# Patient Record
Sex: Male | Born: 1955 | Race: White | Hispanic: No | Marital: Married | Smoking: Never smoker
Health system: Southern US, Community
[De-identification: ages and names within clinical notes are randomized; demographics above are authoritative.]

## PROBLEM LIST (undated history)

## (undated) DIAGNOSIS — L309 Dermatitis, unspecified: Secondary | ICD-10-CM

## (undated) DIAGNOSIS — C61 Malignant neoplasm of prostate: Secondary | ICD-10-CM

## (undated) HISTORY — PX: KNEE ARTHROSCOPY: SUR90

---

## 1997-12-10 ENCOUNTER — Emergency Department (HOSPITAL_COMMUNITY): Admission: EM | Admit: 1997-12-10 | Discharge: 1997-12-10 | Payer: Self-pay | Admitting: Internal Medicine

## 1998-05-10 HISTORY — PX: THROAT SURGERY: SHX803

## 1998-07-04 ENCOUNTER — Other Ambulatory Visit: Admission: RE | Admit: 1998-07-04 | Discharge: 1998-07-04 | Payer: Self-pay | Admitting: Otolaryngology

## 1998-08-22 ENCOUNTER — Other Ambulatory Visit: Admission: RE | Admit: 1998-08-22 | Discharge: 1998-08-22 | Payer: Self-pay | Admitting: Family Medicine

## 2004-05-28 ENCOUNTER — Emergency Department (HOSPITAL_COMMUNITY): Admission: EM | Admit: 2004-05-28 | Discharge: 2004-05-28 | Payer: Self-pay | Admitting: Emergency Medicine

## 2013-04-25 ENCOUNTER — Other Ambulatory Visit: Payer: Self-pay | Admitting: Urology

## 2013-05-02 ENCOUNTER — Other Ambulatory Visit: Payer: Self-pay | Admitting: Urology

## 2013-05-18 ENCOUNTER — Other Ambulatory Visit (HOSPITAL_COMMUNITY): Payer: Self-pay | Admitting: *Deleted

## 2013-05-22 ENCOUNTER — Ambulatory Visit (HOSPITAL_COMMUNITY)
Admission: RE | Admit: 2013-05-22 | Discharge: 2013-05-22 | Disposition: A | Discharge status: Home / Self Care | Payer: BC Managed Care – PPO | Source: Ambulatory Visit | Attending: Urology | Admitting: Urology

## 2013-05-22 ENCOUNTER — Encounter (INDEPENDENT_AMBULATORY_CARE_PROVIDER_SITE_OTHER): Payer: Self-pay

## 2013-05-22 ENCOUNTER — Encounter (HOSPITAL_COMMUNITY): Payer: Self-pay

## 2013-05-22 ENCOUNTER — Encounter (HOSPITAL_COMMUNITY): Payer: Self-pay | Admitting: Pharmacy Technician

## 2013-05-22 ENCOUNTER — Encounter (HOSPITAL_COMMUNITY)
Admission: RE | Admit: 2013-05-22 | Discharge: 2013-05-22 | Disposition: A | Discharge status: Home / Self Care | Payer: BC Managed Care – PPO | Source: Ambulatory Visit | Attending: Urology | Admitting: Urology

## 2013-05-22 DIAGNOSIS — Z0183 Encounter for blood typing: Secondary | ICD-10-CM | POA: Insufficient documentation

## 2013-05-22 DIAGNOSIS — C61 Malignant neoplasm of prostate: Secondary | ICD-10-CM | POA: Insufficient documentation

## 2013-05-22 DIAGNOSIS — Z01810 Encounter for preprocedural cardiovascular examination: Secondary | ICD-10-CM | POA: Insufficient documentation

## 2013-05-22 DIAGNOSIS — Z01818 Encounter for other preprocedural examination: Secondary | ICD-10-CM | POA: Insufficient documentation

## 2013-05-22 DIAGNOSIS — Z01812 Encounter for preprocedural laboratory examination: Secondary | ICD-10-CM | POA: Insufficient documentation

## 2013-05-22 HISTORY — DX: Dermatitis, unspecified: L30.9

## 2013-05-22 HISTORY — DX: Malignant neoplasm of prostate: C61

## 2013-05-22 LAB — CBC
HCT: 38.9 % — ABNORMAL LOW (ref 39.0–52.0)
HEMOGLOBIN: 13.1 g/dL (ref 13.0–17.0)
MCH: 26.9 pg (ref 26.0–34.0)
MCHC: 33.7 g/dL (ref 30.0–36.0)
MCV: 79.9 fL (ref 78.0–100.0)
Platelets: 237 10*3/uL (ref 150–400)
RBC: 4.87 MIL/uL (ref 4.22–5.81)
RDW: 14.1 % (ref 11.5–15.5)
WBC: 3.9 10*3/uL — ABNORMAL LOW (ref 4.0–10.5)

## 2013-05-22 LAB — BASIC METABOLIC PANEL
BUN: 12 mg/dL (ref 6–23)
CO2: 29 mEq/L (ref 19–32)
Calcium: 9.2 mg/dL (ref 8.4–10.5)
Chloride: 105 mEq/L (ref 96–112)
Creatinine, Ser: 0.81 mg/dL (ref 0.50–1.35)
GFR calc Af Amer: >90 mL/min (ref >90)
GLUCOSE: 105 mg/dL — AB (ref 70–99)
POTASSIUM: 4.6 meq/L (ref 3.7–5.3)
SODIUM: 144 meq/L (ref 137–147)

## 2013-05-22 LAB — ABO/RH: ABO/RH(D): O POS

## 2013-05-22 NOTE — Patient Instructions (Signed)
Richard Delgado  05/22/2013                           YOUR PROCEDURE IS SCHEDULED ON:  05/24/13               PLEASE REPORT TO SHORT STAY CENTER AT : 8:30 AM               CALL THIS NUMBER IF ANY PROBLEMS THE DAY OF SURGERY :               [phone]                      REMEMBER:   Do not eat food or drink liquids AFTER MIDNIGHT   Take these medicines the morning of surgery with A SIP OF WATER: NONE   Do not wear jewelry, make-up   Do not wear lotions, powders, or perfumes.   Do not shave legs or underarms 12 hrs. before surgery (men may shave face)  Do not bring valuables to the hospital.  Contacts, dentures or bridgework may not be worn into surgery.  Leave suitcase in the car. After surgery it may be brought to your room.  For patients admitted to the hospital more than one night, checkout time is 11:00                          The day of discharge.   Patients discharged the day of surgery will not be allowed to drive home                             If going home same day of surgery, must have someone stay with you first                           24 hrs at home and arrange for some one to drive you home from hospital.    Special Instructions:   Please read over the following fact sheets that you were given:               1. [employer]                     2. [place]                                                X_____________________________________________________________________        Failure to follow these instructions may result in cancellation of your surgery

## 2013-05-23 NOTE — H&P (Signed)
Chief Complaint Prostate Cancer   Reason For Visit Reason for consult: To discuss treatment options for prostate cancer and specifically to consider a robotic prostatectomy.  Physician requesting consult: Dr. Franchot Gallo  PCP: Dr. Azalia Bilis   History of Present Illness Richard Delgado is a 58 year old who had been on testosterone replacement therapy and was noted to have a rising PSA. He was seen by Dr. Diona Fanti for this reason and his PSA was repeated and noted to be 7.96. He stopped TRT and underwent a prostate biopsy on 03/21/13 which demonstrated Gleason 3+3=6 adenocarcinoma of the prostate with 8 out of 12 biopsy cores positive for malignancy. He has no family history of prostate cancer. He was thoroughly counseled by Dr. Diona Fanti about his treatment options and decided to proceed with surgical treatment. He was actually seen by Dr. Risa Grill for this reason and had been scheduled for surgery but wanted to have his surgery sooner than Dr. Cy Blamer schedule would allow.   He currently is off testosterone replacement therapy Richard Delgado) although states that he did receive significant quality of life benefit when taking this medication. His main symptoms include a decreased energy level, depressed mood, decreased libido, and erectile dysfunction.   TNM stage: cT1c Nx Mx PSA: 7.96 Gleason score: 3+3=6 Biopsy (03/21/13): 8/12 cores positive   Left: L lateral apex (50%), L lateral mid (20%), L lateral base (40%)   Right: R apex (50%), R mid (80%), R lateral mid (80%), R base (10%), R lateral base (<5%) Prostate volume: 24.0 c  Nomogram OC disease: 54% EPE: 39% SVI: 5% LNI: 4% PFS: 88% at 5 years, 80% at 10 years  Urinary function: IPSS is 4. Erectile function: SHIM score is 16.   Past Medical History Problems  1. History of depression (V11.8)  Surgical History Problems  1. History of Knee Surgery 2. History of Throat Surgery  His knee surgery with arthroscopic  surgery.   Current Meds 1. Aspirin 81 MG Oral Tablet;  Therapy: (Recorded:14Jun2013) to Recorded  Allergies Medication  1. No Known Drug Allergies  Family History Problems  1. Family history of Family Health Status Number Of Children   4 sons 2. Denied: Family history of prostate cancer  Social History Problems    Denied: History of Alcohol Use   Marital History - Currently Married   Never A Smoker   Occupation:   self employeed  Review of Systems Constitutional, skin, eye, otolaryngeal, hematologic/lymphatic, cardiovascular, pulmonary, endocrine, musculoskeletal, gastrointestinal, neurological and psychiatric system(s) were reviewed and pertinent findings if present are noted.    Vitals Vital Signs [Data Includes: Last 1 Day]  Recorded: [id number] 08:07AM  Blood Pressure: 134 / 81 Temperature: 97.8 F Heart Rate: 63  Physical Exam Constitutional: Well nourished and well developed . No acute distress.  ENT:. The ears and nose are normal in appearance.  Neck: The appearance of the neck is normal and no neck mass is present.  Pulmonary: No respiratory distress, normal respiratory rhythm and effort and clear bilateral breath sounds.  Cardiovascular: Heart rate and rhythm are normal . No peripheral edema.  Abdomen: The abdomen is soft and nontender. No masses are palpated. No CVA tenderness. No hernias are palpable. No hepatosplenomegaly noted.  Rectal: Rectal exam demonstrates normal sphincter tone, no tenderness and no masses. Prostate size is estimated to be 30 g. The prostate has no nodularity and is not tender. The left seminal vesicle is nonpalpable. The right seminal vesicle is nonpalpable. The perineum is normal  on inspection.  Lymphatics: The femoral and inguinal nodes are not enlarged or tender.  Skin: Normal skin turgor, no visible rash and no visible skin lesions.  Neuro/Psych:. Mood and affect are appropriate.    Results/Data Urine [Data Includes: Last 1  Day]   [id number]  COLOR YELLOW   APPEARANCE CLEAR   SPECIFIC GRAVITY 1.025   pH 6.0   GLUCOSE NEG mg/dL  BILIRUBIN NEG   KETONE NEG mg/dL  BLOOD NEG   PROTEIN NEG mg/dL  UROBILINOGEN 0.2 mg/dL  NITRITE NEG   LEUKOCYTE ESTERASE TRACE   SQUAMOUS EPITHELIAL/HPF NONE SEEN   WBC 0-2 WBC/hpf  RBC 0-2 RBC/hpf  BACTERIA NONE SEEN   CRYSTALS NONE SEEN   CASTS NONE SEEN    I have reviewed his medical records, PSA results, and pathology report. Findings are as dictated above.   Assessment  Adenocarcinoma of prostate (185) (C61)   Plan Adenocarcinoma of prostate  1. Follow-up Keep Future Appt Office  Follow-up  Status: Complete  Done: [id number] Health Maintenance  2. UA With REFLEX; [Do Not Release]; Status:Complete;   [id number] 08:03AM  Discussion/Summary 1. Prostate cancer: I had a detailed discussion with Richard Delgado and his wife today regarding his diagnosis and treatment options. He has decided to proceed with surgical therapy and is here today specifically to discuss that treatment option and to proceed with surgery in the near future.   The patient was counseled about the natural history of prostate cancer and the standard treatment options that are available for prostate cancer. It was explained to him how his age and life expectancy, clinical stage, Gleason score, and PSA affect his prognosis, the decision to proceed with additional staging studies, as well as how that information influences recommended treatment strategies. We discussed the roles for active surveillance, radiation therapy, surgical therapy, androgen deprivation, as well as ablative therapy options for the treatment of prostate cancer as appropriate to his individual cancer situation. We discussed the risks and benefits of these options with regard to their impact on cancer control and also in terms of potential adverse events, complications, and impact on quiality of life particularly related to urinary,  bowel, and sexual function. The patient was encouraged to ask questions throughout the discussion today and all questions were answered to his stated satisfaction. In addition, the patient was provided with and/or directed to appropriate resources and literature for further education about prostate cancer and treatment options.   We discussed surgical therapy for prostate cancer including the different available surgical approaches. We discussed, in detail, the risks and expectations of surgery with regard to cancer control, urinary control, and erectile function as well as the expected postoperative recovery process. Additional risks of surgery including but not limited to bleeding, infection, hernia formation, nerve damage, lymphocele formation, bowel/rectal injury potentially necessitating colostomy, damage to the urinary tract resulting in urine leakage, urethral stricture, and the cardiopulmonary risks such as myocardial infarction, stroke, death, venothromboembolism, etc. were explained. The risk of open surgical conversion for robotic/laparoscopic prostatectomy was also discussed.     He agrees to proceed and will be scheduled for a bilateral nerve sparing robotic-assisted laparoscopic radical prostatectomy.    2. Testosterone deficiency: We also discussed the potential risks and benefits of testosterone replacement therapy after definitive curative treatment for prostate cancer. He understands that this may be a reasonable therapy to consider assuming that his PSA becoming undetectable after treatment and there is a reasonable time to assure him that he is indeed  disease free. Considering the significant benefit that he received as related to his quality of life, this is a reasonable option to consider in the future.    Cc: Dr. Azalia Bilis  Dr. Franchot Gallo  A total of 45 minutes were spent in the overall care of the patient today with 40 minutes in direct face to face consultation.     Signatures Electronically signed by : Raynelle Bring, M.D.; May 15 2013  9:07AM EST

## 2013-05-24 ENCOUNTER — Inpatient Hospital Stay (HOSPITAL_COMMUNITY): Payer: BC Managed Care – PPO | Admitting: Anesthesiology

## 2013-05-24 ENCOUNTER — Inpatient Hospital Stay (HOSPITAL_COMMUNITY)
Admission: RE | Admit: 2013-05-24 | Discharge: 2013-05-25 | DRG: 708 | Disposition: A | Discharge status: Home / Self Care | Payer: BC Managed Care – PPO | Source: Ambulatory Visit | Attending: Urology | Admitting: Urology

## 2013-05-24 ENCOUNTER — Encounter (HOSPITAL_COMMUNITY): Payer: BC Managed Care – PPO | Admitting: Anesthesiology

## 2013-05-24 ENCOUNTER — Encounter (HOSPITAL_COMMUNITY): Payer: Self-pay | Admitting: *Deleted

## 2013-05-24 ENCOUNTER — Encounter (HOSPITAL_COMMUNITY)
Admission: RE | Disposition: A | Discharge status: Home / Self Care | Payer: Self-pay | Source: Ambulatory Visit | Attending: Urology

## 2013-05-24 DIAGNOSIS — Z7982 Long term (current) use of aspirin: Secondary | ICD-10-CM

## 2013-05-24 DIAGNOSIS — R6882 Decreased libido: Secondary | ICD-10-CM | POA: Diagnosis present

## 2013-05-24 DIAGNOSIS — N529 Male erectile dysfunction, unspecified: Secondary | ICD-10-CM | POA: Diagnosis present

## 2013-05-24 DIAGNOSIS — C61 Malignant neoplasm of prostate: Principal | ICD-10-CM | POA: Diagnosis present

## 2013-05-24 HISTORY — PX: ROBOT ASSISTED LAPAROSCOPIC RADICAL PROSTATECTOMY: SHX5141

## 2013-05-24 LAB — HEMOGLOBIN AND HEMATOCRIT, BLOOD
HCT: 33.8 % — ABNORMAL LOW (ref 39.0–52.0)
Hemoglobin: 11.3 g/dL — ABNORMAL LOW (ref 13.0–17.0)

## 2013-05-24 LAB — TYPE AND SCREEN
ABO/RH(D): O POS
ANTIBODY SCREEN: NEGATIVE

## 2013-05-24 SURGERY — ROBOTIC ASSISTED LAPAROSCOPIC RADICAL PROSTATECTOMY LEVEL 1
Anesthesia: General

## 2013-05-24 MED ORDER — KCL IN DEXTROSE-NACL 20-5-0.45 MEQ/L-%-% IV SOLN
INTRAVENOUS | Status: AC
  Administered 2013-05-24: 1000 mL
  Filled 2013-05-24: qty 1000

## 2013-05-24 MED ORDER — OXYCODONE HCL 5 MG PO TABS: 5.0000 mg | ORAL_TABLET | Freq: Once | ORAL | Status: DC | PRN

## 2013-05-24 MED ORDER — LACTATED RINGERS IV SOLN
INTRAVENOUS | Status: DC | PRN
  Administered 2013-05-24: 10:00:00 via INTRAVENOUS

## 2013-05-24 MED ORDER — CEFAZOLIN SODIUM 1-5 GM-% IV SOLN
1.0000 g | Freq: Three times a day (TID) | INTRAVENOUS | Status: AC
  Administered 2013-05-24 – 2013-05-25 (×2): 1 g via INTRAVENOUS
  Filled 2013-05-24 (×4): qty 50

## 2013-05-24 MED ORDER — LACTATED RINGERS IV SOLN: INTRAVENOUS | Status: DC

## 2013-05-24 MED ORDER — DIPHENHYDRAMINE HCL 12.5 MG/5ML PO ELIX: 12.5000 mg | ORAL_SOLUTION | Freq: Four times a day (QID) | ORAL | Status: DC | PRN

## 2013-05-24 MED ORDER — HYDROMORPHONE HCL PF 1 MG/ML IJ SOLN
INTRAMUSCULAR | Status: AC
  Filled 2013-05-24: qty 1

## 2013-05-24 MED ORDER — CEFAZOLIN SODIUM-DEXTROSE 2-3 GM-% IV SOLR
2.0000 g | INTRAVENOUS | Status: AC
  Administered 2013-05-24: 2 g via INTRAVENOUS
  Filled 2013-05-24: qty 50

## 2013-05-24 MED ORDER — NEOSTIGMINE METHYLSULFATE 1 MG/ML IJ SOLN
INTRAMUSCULAR | Status: DC | PRN
  Administered 2013-05-24: 4 mg via INTRAVENOUS

## 2013-05-24 MED ORDER — GLYCOPYRROLATE 0.2 MG/ML IJ SOLN
INTRAMUSCULAR | Status: DC | PRN
  Administered 2013-05-24: 0.6 mg via INTRAVENOUS
  Administered 2013-05-24: 0.2 mg via INTRAVENOUS

## 2013-05-24 MED ORDER — SODIUM CHLORIDE 0.9 % IV SOLN
INTRAVENOUS | Status: DC | PRN
  Administered 2013-05-24 (×2): via INTRAVENOUS

## 2013-05-24 MED ORDER — ZOLPIDEM TARTRATE 5 MG PO TABS: 5.0000 mg | ORAL_TABLET | Freq: Every evening | ORAL | Status: DC | PRN

## 2013-05-24 MED ORDER — CIPROFLOXACIN HCL 500 MG PO TABS: 500.0000 mg | ORAL_TABLET | Freq: Two times a day (BID) | ORAL | Status: DC

## 2013-05-24 MED ORDER — DEXAMETHASONE SODIUM PHOSPHATE 10 MG/ML IJ SOLN
INTRAMUSCULAR | Status: DC | PRN
  Administered 2013-05-24: 10 mg via INTRAVENOUS

## 2013-05-24 MED ORDER — ACETAMINOPHEN 325 MG PO TABS: 650.0000 mg | ORAL_TABLET | ORAL | Status: DC | PRN

## 2013-05-24 MED ORDER — OXYCODONE HCL 5 MG/5ML PO SOLN
5.0000 mg | Freq: Once | ORAL | Status: DC | PRN
  Filled 2013-05-24: qty 5

## 2013-05-24 MED ORDER — CISATRACURIUM BESYLATE 20 MG/10ML IV SOLN
INTRAVENOUS | Status: AC
  Filled 2013-05-24: qty 10

## 2013-05-24 MED ORDER — ONDANSETRON HCL 4 MG/2ML IJ SOLN: 4.0000 mg | INTRAMUSCULAR | Status: DC | PRN

## 2013-05-24 MED ORDER — PROPOFOL 10 MG/ML IV BOLUS
INTRAVENOUS | Status: AC
  Filled 2013-05-24: qty 20

## 2013-05-24 MED ORDER — BACITRACIN-NEOMYCIN-POLYMYXIN 400-5-5000 EX OINT: 1.0000 "application " | TOPICAL_OINTMENT | Freq: Three times a day (TID) | CUTANEOUS | Status: DC | PRN

## 2013-05-24 MED ORDER — MENTHOL 3 MG MT LOZG
1.0000 | LOZENGE | OROMUCOSAL | Status: DC | PRN
  Filled 2013-05-24: qty 9

## 2013-05-24 MED ORDER — KETOROLAC TROMETHAMINE 15 MG/ML IJ SOLN
15.0000 mg | Freq: Four times a day (QID) | INTRAMUSCULAR | Status: DC
  Administered 2013-05-24 – 2013-05-25 (×3): 15 mg via INTRAVENOUS
  Filled 2013-05-24 (×6): qty 1

## 2013-05-24 MED ORDER — MIDAZOLAM HCL 5 MG/5ML IJ SOLN
INTRAMUSCULAR | Status: DC | PRN
  Administered 2013-05-24: 2 mg via INTRAVENOUS

## 2013-05-24 MED ORDER — KCL IN DEXTROSE-NACL 20-5-0.45 MEQ/L-%-% IV SOLN
INTRAVENOUS | Status: DC
  Administered 2013-05-24 – 2013-05-25 (×3): via INTRAVENOUS
  Filled 2013-05-24 (×4): qty 1000

## 2013-05-24 MED ORDER — SODIUM CHLORIDE 0.9 % IR SOLN
Status: DC | PRN
  Administered 2013-05-24: 1000 mL via INTRAVESICAL

## 2013-05-24 MED ORDER — SODIUM CHLORIDE 0.9 % IJ SOLN
INTRAMUSCULAR | Status: AC
  Filled 2013-05-24: qty 10

## 2013-05-24 MED ORDER — EPHEDRINE SULFATE 50 MG/ML IJ SOLN
INTRAMUSCULAR | Status: DC | PRN
  Administered 2013-05-24: 7.5 mg via INTRAVENOUS

## 2013-05-24 MED ORDER — HEPARIN SODIUM (PORCINE) 1000 UNIT/ML IJ SOLN
INTRAMUSCULAR | Status: AC
  Filled 2013-05-24: qty 1

## 2013-05-24 MED ORDER — EPHEDRINE SULFATE 50 MG/ML IJ SOLN
INTRAMUSCULAR | Status: AC
  Filled 2013-05-24: qty 1

## 2013-05-24 MED ORDER — MEPERIDINE HCL 50 MG/ML IJ SOLN: 6.2500 mg | INTRAMUSCULAR | Status: DC | PRN

## 2013-05-24 MED ORDER — ONDANSETRON HCL 4 MG/2ML IJ SOLN
INTRAMUSCULAR | Status: DC | PRN
  Administered 2013-05-24: 4 mg via INTRAVENOUS

## 2013-05-24 MED ORDER — LACTATED RINGERS IV SOLN
INTRAVENOUS | Status: DC | PRN
  Administered 2013-05-24: 11:00:00

## 2013-05-24 MED ORDER — FENTANYL CITRATE 0.05 MG/ML IJ SOLN
INTRAMUSCULAR | Status: AC
  Filled 2013-05-24: qty 5

## 2013-05-24 MED ORDER — SODIUM CHLORIDE 0.9 % IV BOLUS (SEPSIS)
1000.0000 mL | Freq: Once | INTRAVENOUS | Status: AC
  Administered 2013-05-24: 1000 mL via INTRAVENOUS

## 2013-05-24 MED ORDER — BUPIVACAINE-EPINEPHRINE 0.25% -1:200000 IJ SOLN
INTRAMUSCULAR | Status: DC | PRN
  Administered 2013-05-24: 27 mL

## 2013-05-24 MED ORDER — MIDAZOLAM HCL 2 MG/2ML IJ SOLN
INTRAMUSCULAR | Status: AC
  Filled 2013-05-24: qty 2

## 2013-05-24 MED ORDER — FENTANYL CITRATE 0.05 MG/ML IJ SOLN
INTRAMUSCULAR | Status: DC | PRN
  Administered 2013-05-24 (×2): 50 ug via INTRAVENOUS
  Administered 2013-05-24: 100 ug via INTRAVENOUS

## 2013-05-24 MED ORDER — LACTATED RINGERS IR SOLN
Status: DC | PRN
  Administered 2013-05-24: 1000 mL

## 2013-05-24 MED ORDER — ONDANSETRON HCL 4 MG/2ML IJ SOLN
INTRAMUSCULAR | Status: AC
  Filled 2013-05-24: qty 2

## 2013-05-24 MED ORDER — PROMETHAZINE HCL 25 MG/ML IJ SOLN: 6.2500 mg | INTRAMUSCULAR | Status: DC | PRN

## 2013-05-24 MED ORDER — HYDROCODONE-ACETAMINOPHEN 5-325 MG PO TABS: 1.0000 | ORAL_TABLET | Freq: Four times a day (QID) | ORAL | Status: DC | PRN

## 2013-05-24 MED ORDER — MORPHINE SULFATE 2 MG/ML IJ SOLN
2.0000 mg | INTRAMUSCULAR | Status: DC | PRN
  Administered 2013-05-24: 4 mg via INTRAVENOUS
  Filled 2013-05-24 (×2): qty 2

## 2013-05-24 MED ORDER — BUPIVACAINE-EPINEPHRINE PF 0.25-1:200000 % IJ SOLN
INTRAMUSCULAR | Status: AC
  Filled 2013-05-24: qty 30

## 2013-05-24 MED ORDER — DEXAMETHASONE SODIUM PHOSPHATE 10 MG/ML IJ SOLN
INTRAMUSCULAR | Status: AC
  Filled 2013-05-24: qty 1

## 2013-05-24 MED ORDER — ACETAMINOPHEN 325 MG PO TABS
650.0000 mg | ORAL_TABLET | ORAL | Status: DC | PRN
  Administered 2013-05-24: 650 mg via ORAL
  Filled 2013-05-24: qty 2

## 2013-05-24 MED ORDER — GLYCOPYRROLATE 0.2 MG/ML IJ SOLN
INTRAMUSCULAR | Status: AC
  Filled 2013-05-24: qty 4

## 2013-05-24 MED ORDER — HYDROMORPHONE HCL PF 1 MG/ML IJ SOLN
0.2500 mg | INTRAMUSCULAR | Status: DC | PRN
  Administered 2013-05-24 (×4): 0.5 mg via INTRAVENOUS

## 2013-05-24 MED ORDER — DIPHENHYDRAMINE HCL 50 MG/ML IJ SOLN: 12.5000 mg | Freq: Four times a day (QID) | INTRAMUSCULAR | Status: DC | PRN

## 2013-05-24 MED ORDER — CISATRACURIUM BESYLATE (PF) 10 MG/5ML IV SOLN
INTRAVENOUS | Status: DC | PRN
  Administered 2013-05-24: 8 mg via INTRAVENOUS
  Administered 2013-05-24: 2 mg via INTRAVENOUS
  Administered 2013-05-24: 4 mg via INTRAVENOUS

## 2013-05-24 MED ORDER — PHENOL 1.4 % MT LIQD
1.0000 | OROMUCOSAL | Status: DC | PRN
  Filled 2013-05-24: qty 177

## 2013-05-24 MED ORDER — DOCUSATE SODIUM 100 MG PO CAPS
100.0000 mg | ORAL_CAPSULE | Freq: Two times a day (BID) | ORAL | Status: DC
  Administered 2013-05-24 – 2013-05-25 (×2): 100 mg via ORAL
  Filled 2013-05-24 (×3): qty 1

## 2013-05-24 MED ORDER — PROPOFOL 10 MG/ML IV BOLUS
INTRAVENOUS | Status: DC | PRN
  Administered 2013-05-24: 150 mg via INTRAVENOUS

## 2013-05-24 MED ORDER — BISACODYL 10 MG RE SUPP: 10.0000 mg | Freq: Every day | RECTAL | Status: DC | PRN

## 2013-05-24 MED ORDER — BELLADONNA ALKALOIDS-OPIUM 16.2-60 MG RE SUPP: 1.0000 | Freq: Four times a day (QID) | RECTAL | Status: DC | PRN

## 2013-05-24 SURGICAL SUPPLY — 45 items
ADH SKN CLS APL DERMABOND .7 (GAUZE/BANDAGES/DRESSINGS) ×1
CABLE HIGH FREQUENCY MONO STRZ (ELECTRODE) ×2 IMPLANT
CANISTER SUCTION 2500CC (MISCELLANEOUS) ×2 IMPLANT
CATH FOLEY 2WAY SLVR 18FR 30CC (CATHETERS) ×2 IMPLANT
CATH ROBINSON RED A/P 16FR (CATHETERS) ×2 IMPLANT
CATH ROBINSON RED A/P 8FR (CATHETERS) ×2 IMPLANT
CATH TIEMANN FOLEY 18FR 5CC (CATHETERS) ×2 IMPLANT
CHLORAPREP W/TINT 26ML (MISCELLANEOUS) ×2 IMPLANT
CLIP LIGATING HEM O LOK PURPLE (MISCELLANEOUS) ×4 IMPLANT
CLOTH BEACON ORANGE TIMEOUT ST (SAFETY) ×2 IMPLANT
COVER SURGICAL LIGHT HANDLE (MISCELLANEOUS) ×2 IMPLANT
COVER TIP SHEARS 8 DVNC (MISCELLANEOUS) ×1 IMPLANT
COVER TIP SHEARS 8MM DA VINCI (MISCELLANEOUS) ×1
CUTTER ECHEON FLEX ENDO 45 340 (ENDOMECHANICALS) ×2 IMPLANT
DECANTER SPIKE VIAL GLASS SM (MISCELLANEOUS) ×1 IMPLANT
DERMABOND ADVANCED (GAUZE/BANDAGES/DRESSINGS) ×1
DERMABOND ADVANCED .7 DNX12 (GAUZE/BANDAGES/DRESSINGS) IMPLANT
DRAPE SURG IRRIG POUCH 19X23 (DRAPES) ×2 IMPLANT
DRSG TEGADERM 4X4.75 (GAUZE/BANDAGES/DRESSINGS) ×2 IMPLANT
DRSG TEGADERM 6X8 (GAUZE/BANDAGES/DRESSINGS) ×4 IMPLANT
ELECT REM PT RETURN 9FT ADLT (ELECTROSURGICAL) ×2
ELECTRODE REM PT RTRN 9FT ADLT (ELECTROSURGICAL) ×1 IMPLANT
GLOVE BIO SURGEON STRL SZ 6.5 (GLOVE) ×2 IMPLANT
GLOVE BIOGEL M STRL SZ7.5 (GLOVE) ×7 IMPLANT
GOWN STRL REUS W/TWL LRG LVL3 (GOWN DISPOSABLE) ×5 IMPLANT
GOWN STRL REUS W/TWL XL LVL3 (GOWN DISPOSABLE) ×2 IMPLANT
HOLDER FOLEY CATH W/STRAP (MISCELLANEOUS) ×2 IMPLANT
IV LACTATED RINGERS 1000ML (IV SOLUTION) ×2 IMPLANT
KIT ACCESSORY DA VINCI DISP (KITS) ×1
KIT ACCESSORY DVNC DISP (KITS) ×1 IMPLANT
NDL SAFETY ECLIPSE 18X1.5 (NEEDLE) ×1 IMPLANT
NEEDLE HYPO 18GX1.5 SHARP (NEEDLE) ×2
PACK ROBOT UROLOGY CUSTOM (CUSTOM PROCEDURE TRAY) ×2 IMPLANT
RELOAD GREEN ECHELON 45 (STAPLE) ×2 IMPLANT
SET TUBE IRRIG SUCTION NO TIP (IRRIGATION / IRRIGATOR) ×2 IMPLANT
SOLUTION ELECTROLUBE (MISCELLANEOUS) ×2 IMPLANT
SUT ETHILON 3 0 PS 1 (SUTURE) ×2 IMPLANT
SUT MNCRL 3 0 VIOLET RB1 (SUTURE) IMPLANT
SUT MNCRL AB 4-0 PS2 18 (SUTURE) ×4 IMPLANT
SUT MONOCRYL 3 0 RB1 (SUTURE) ×1
SUT VICRYL 0 UR6 27IN ABS (SUTURE) ×4 IMPLANT
SYR 27GX1/2 1ML LL SAFETY (SYRINGE) ×2 IMPLANT
TOWEL OR 17X26 10 PK STRL BLUE (TOWEL DISPOSABLE) ×2 IMPLANT
TOWEL OR NON WOVEN STRL DISP B (DISPOSABLE) ×2 IMPLANT
WATER STERILE IRR 1500ML POUR (IV SOLUTION) ×4 IMPLANT

## 2013-05-24 NOTE — Discharge Instructions (Signed)

## 2013-05-24 NOTE — Anesthesia Postprocedure Evaluation (Signed)
Anesthesia Post Note  Patient: Richard Delgado  Procedure(s) Performed: Procedure(s) (LRB): ROBOTIC ASSISTED LAPAROSCOPIC RADICAL PROSTATECTOMY LEVEL 1 (N/A)  Anesthesia type: General  Patient location: PACU  Post pain: Pain level controlled  Post assessment: Post-op Vital signs reviewed  Last Vitals: BP 152/85  Pulse 59  Temp(Src) 36.7 C (Oral)  Resp 10  SpO2 100%  Post vital signs: Reviewed  Level of consciousness: sedated  Complications: No apparent anesthesia complications

## 2013-05-24 NOTE — Op Note (Signed)
Preoperative diagnosis: Clinically localized adenocarcinoma of the prostate (clinical stage T1c Nx Mx)  Postoperative diagnosis: Clinically localized adenocarcinoma of the prostate (clinical stage T1c Nx Mx)  Procedure:  1. Robotic assisted laparoscopic radical prostatectomy (bilateral nerve sparing)  Surgeon: Roxy Horseman, Brooke Bonito. M.D.  Assistant: Leta Baptist, PA-C  Anesthesia: General  Complications: None  EBL: 100 mL  IVF:  1200 mL crystalloid  Specimens: 1. Prostate and seminal vesicles  Disposition of specimens: Pathology  Drains: 1. 20 Fr coude catheter 2. # 19 Blake pelvic drain  Indication: Richard Delgado is a 58 y.o. year old patient with clinically localized prostate cancer.  After a thorough review of the management options for treatment of prostate cancer, he elected to proceed with surgical therapy and the above procedure(s).  We have discussed the potential benefits and risks of the procedure, side effects of the proposed treatment, the likelihood of the patient achieving the goals of the procedure, and any potential problems that might occur during the procedure or recuperation. Informed consent has been obtained.  Description of procedure:  The patient was taken to the operating room and a general anesthetic was administered. He was given preoperative antibiotics, placed in the dorsal lithotomy position, and prepped and draped in the usual sterile fashion. Next a preoperative timeout was performed. A urethral catheter was placed into the bladder and a site was selected near the umbilicus for placement of the camera port. This was placed using a standard open Hassan technique which allowed entry into the peritoneal cavity under direct vision and without difficulty. A 12 mm port was placed and a pneumoperitoneum established. The camera was then used to inspect the abdomen and there was no evidence of any intra-abdominal injuries or other abnormalities. The  remaining abdominal ports were then placed. 8 mm robotic ports were placed in the right lower quadrant, left lower quadrant, and far left lateral abdominal wall. A 5 mm port was placed in the right upper quadrant and a 12 mm port was placed in the right lateral abdominal wall for laparoscopic assistance. All ports were placed under direct vision without difficulty. The surgical cart was then docked.   Utilizing the cautery scissors, the bladder was reflected posteriorly allowing entry into the space of Retzius and identification of the endopelvic fascia and prostate. The periprostatic fat was then removed from the prostate allowing full exposure of the endopelvic fascia. The endopelvic fascia was then incised from the apex back to the base of the prostate bilaterally and the underlying levator muscle fibers were swept laterally off the prostate thereby isolating the dorsal venous complex. The dorsal vein was then stapled and divided with a 45 mm Flex Echelon stapler. Attention then turned to the bladder neck which was divided anteriorly thereby allowing entry into the bladder and exposure of the urethral catheter. The catheter balloon was deflated and the catheter was brought into the operative field and used to retract the prostate anteriorly. The posterior bladder neck was then examined and was divided allowing further dissection between the bladder and prostate posteriorly until the vasa deferentia and seminal vessels were identified. The vasa deferentia were isolated, divided, and lifted anteriorly. The seminal vesicles were dissected down to their tips with care to control the seminal vascular arterial blood supply. These structures were then lifted anteriorly and the space between Denonvillier's fascia and the anterior rectum was developed with a combination of sharp and blunt dissection. This isolated the vascular pedicles of the prostate.  The lateral prostatic fascia  was then sharply incised allowing  release of the neurovascular bundles bilaterally. The vascular pedicles of the prostate were then ligated with Weck clips between the prostate and neurovascular bundles and divided with sharp cold scissor dissection resulting in neurovascular bundle preservation. The neurovascular bundles were then separated off the apex of the prostate and urethra bilaterally.  The urethra was then sharply transected allowing the prostate specimen to be disarticulated. The pelvis was copiously irrigated and hemostasis was ensured. There was no evidence for rectal injury.  Attention then turned to the urethral anastomosis. A 2-0 Vicryl slip knot was placed between Denonvillier's fascia, the posterior bladder neck, and the posterior urethra to reapproximate these structures. A double-armed 3-0 Monocryl suture was then used to perform a 360 running tension-free anastomosis between the bladder neck and urethra. A new urethral catheter was then placed into the bladder and irrigated. There were no blood clots within the bladder and the anastomosis appeared to be watertight. A #19 Blake drain was then brought through the left lateral 8 mm port site and positioned appropriately within the pelvis. It was secured to the skin with a nylon suture. The surgical cart was then undocked. The right lateral 12 mm port site was closed at the fascial level with a 0 Vicryl suture placed laparoscopically. All remaining ports were then removed under direct vision. The prostate specimen was removed intact within the Endopouch retrieval bag via the periumbilical camera port site. This fascial opening was closed with two running 0 Vicryl sutures. 0.25% Marcaine was then injected into all port sites and all incisions were reapproximated at the skin level with 4-0 Monocryl subcuticular sutures and Dermabond. The patient appeared to tolerate the procedure well and without complications. The patient was able to be extubated and transferred to the recovery  unit in satisfactory condition.  Pryor Curia MD

## 2013-05-24 NOTE — Progress Notes (Signed)
Patient ambulated in hall on RA x2 without any assistive device. Steady ambulation. Wife walked with him both times. Patient tolerated very well. Complains of no pain at this time. Resting comfortably. Will continue to monitor. Blanchard Kelch, RN

## 2013-05-24 NOTE — Progress Notes (Signed)
Dr. Alinda Money notified that patient ate all day yesterday and has not done a bowel prep. He was never given these instructions

## 2013-05-24 NOTE — Transfer of Care (Signed)
Immediate Anesthesia Transfer of Care Note  Patient: Richard Delgado  Procedure(s) Performed: Procedure(s): ROBOTIC ASSISTED LAPAROSCOPIC RADICAL PROSTATECTOMY LEVEL 1 (N/A)  Patient Location: PACU  Anesthesia Type:General  Level of Consciousness: awake, sedated and patient cooperative  Airway & Oxygen Therapy: Patient Spontanous Breathing and Patient connected to face mask oxygen  Post-op Assessment: Report given to PACU RN and Post -op Vital signs reviewed and stable  Post vital signs: Reviewed and stable  Complications: No apparent anesthesia complications

## 2013-05-24 NOTE — Progress Notes (Signed)
Hgb. And Hct. Drawn by lab. 

## 2013-05-24 NOTE — Interval H&P Note (Signed)
History and Physical Interval Note:  05/24/2013 9:59 AM  Richard Delgado  has presented today for surgery, with the diagnosis of PROSTATE CANCER  The various methods of treatment have been discussed with the patient and family. After consideration of risks, benefits and other options for treatment, the patient has consented to  Procedure(s): ROBOTIC ASSISTED LAPAROSCOPIC RADICAL PROSTATECTOMY LEVEL 1 (N/A) as a surgical intervention .  The patient's history has been reviewed, patient examined, no change in status, stable for surgery.  I have reviewed the patient's chart and labs.  Questions were answered to the patient's satisfaction.  Richard Delgado informed me that he did not complete his bowel prep and ate regular food yesterday. I again explained the small risk of rectal or bowel injury associated with this procedure and the increased risk of infection that may be present without bowel preparation.  He stated that he understood this risk and wished to proceed with surgery.  Considering that the overall risk would be very low, I agreed that it would be reasonable to proceed.   Richard Delgado,LES

## 2013-05-24 NOTE — Progress Notes (Signed)
Patient ID: Johnjoseph Rolfe, male   DOB: [birth year], 57 y.o.   MRN: [id number]  Post-op note  Subjective: The patient is doing well.  No complaints.  Objective: Vital signs in last 24 hours: Temp:  [96.8 F (36 C)-97.9 F (36.6 C)] 97.9 F (36.6 C) (01/15 1400) Pulse Rate:  [53-104] 60 (01/15 1400) Resp:  [11-18] 11 (01/15 1400) BP: (137-169)/(83-95) 169/92 mmHg (01/15 1345) SpO2:  [99 %-100 %] 100 % (01/15 1400)  Intake/Output from previous day:   Intake/Output this shift: Total I/O In: 3100 [I.V.:2100; IV Piggyback:1000] Out: 100 [Blood:100]  Physical Exam:  General: Alert and oriented. Abdomen: Soft, Nondistended. Incisions: Clean and dry.  Lab Results:  Recent Labs  05/22/13 0910 05/24/13 1302  HGB 13.1 11.3*  HCT 38.9* 33.8*    Assessment/Plan: POD#0   1) Continue to monitor, ambulate   Pryor Curia. MD   LOS: 0 days   Leslie Jester,LES 05/24/2013, 2:17 PM

## 2013-05-24 NOTE — Anesthesia Preprocedure Evaluation (Signed)
Anesthesia Evaluation  Patient identified by MRN, date of birth, ID band Patient awake    Reviewed: Allergy & Precautions, H&P , NPO status , Patient's Chart, lab work & pertinent test results  Airway Mallampati: II TM Distance: >3 FB Neck ROM: Full    Dental  (+) Dental Advisory Given   Pulmonary neg pulmonary ROS,  breath sounds clear to auscultation        Cardiovascular negative cardio ROS  Rhythm:Regular Rate:Normal     Neuro/Psych negative neurological ROS  negative psych ROS   GI/Hepatic negative GI ROS, Neg liver ROS,   Endo/Other  negative endocrine ROS  Renal/GU negative Renal ROS     Musculoskeletal negative musculoskeletal ROS (+)   Abdominal   Peds  Hematology negative hematology ROS (+)   Anesthesia Other Findings   Reproductive/Obstetrics negative OB ROS                           Anesthesia Physical Anesthesia Plan  ASA: I  Anesthesia Plan: General   Post-op Pain Management:    Induction: Intravenous  Airway Management Planned: Oral ETT  Additional Equipment:   Intra-op Plan:   Post-operative Plan: Extubation in OR  Informed Consent: I have reviewed the patients History and Physical, chart, labs and discussed the procedure including the risks, benefits and alternatives for the proposed anesthesia with the patient or authorized representative who has indicated his/her understanding and acceptance.   Dental advisory given  Plan Discussed with: CRNA  Anesthesia Plan Comments:         Anesthesia Quick Evaluation

## 2013-05-24 NOTE — Progress Notes (Signed)
Hgb. And Hct. Results noted. 

## 2013-05-25 ENCOUNTER — Encounter (HOSPITAL_COMMUNITY): Payer: Self-pay | Admitting: Urology

## 2013-05-25 LAB — HEMOGLOBIN AND HEMATOCRIT, BLOOD
HCT: 33.5 % — ABNORMAL LOW (ref 39.0–52.0)
Hemoglobin: 11.3 g/dL — ABNORMAL LOW (ref 13.0–17.0)

## 2013-05-25 MED ORDER — HYDROCODONE-ACETAMINOPHEN 5-325 MG PO TABS: 1.0000 | ORAL_TABLET | Freq: Four times a day (QID) | ORAL | Status: DC | PRN

## 2013-05-25 MED ORDER — BISACODYL 10 MG RE SUPP
10.0000 mg | Freq: Once | RECTAL | Status: AC
  Administered 2013-05-25: 10 mg via RECTAL
  Filled 2013-05-25: qty 1

## 2013-05-25 NOTE — Discharge Summary (Signed)
  Date of admission: 05/24/2013  Date of discharge: 05/25/2013  Admission diagnosis: Prostate Cancer  Discharge diagnosis: Prostate Cancer  History and Physical: For full details, please see admission history and physical. Briefly, Richard Delgado is a 58 y.o. gentleman with localized prostate cancer.  After discussing management/treatment options, he elected to proceed with surgical treatment.  Hospital Course: Richard Delgado was taken to the operating room on 05/24/2013 and underwent a robotic assisted laparoscopic radical prostatectomy. He tolerated this procedure well and without complications. Postoperatively, he was able to be transferred to a regular hospital room following recovery from anesthesia.  He was able to begin ambulating the night of surgery. He remained hemodynamically stable overnight.  He had excellent urine output with appropriately minimal output from his pelvic drain and his pelvic drain was removed on POD #1.  He was transitioned to oral pain medication, tolerated a clear liquid diet, and had met all discharge criteria and was able to be discharged home later on POD#1.  Laboratory values:  Recent Labs  05/24/13 1302 05/25/13 0507  HGB 11.3* 11.3*  HCT 33.8* 33.5*    Disposition: Home  Discharge instruction: He was instructed to be ambulatory but to refrain from heavy lifting, strenuous activity, or driving. He was instructed on urethral catheter care.  Discharge medications:     Medication List         ciprofloxacin 500 MG tablet  Commonly known as:  CIPRO  Take 1 tablet (500 mg total) by mouth 2 (two) times daily. Start day prior to office visit for foley removal     HYDROcodone-acetaminophen 5-325 MG per tablet  Commonly known as:  NORCO  Take 1-2 tablets by mouth every 6 (six) hours as needed.        Followup: He will followup in 1 week for catheter removal and to discuss his surgical pathology results.

## 2013-05-25 NOTE — Progress Notes (Signed)
Patient ID: Richard Delgado, male   DOB: 02/11/1956, 58 y.o.   MRN: [id number]  1 Day Post-Op Subjective: The patient is doing well.  No nausea or vomiting. Pain is adequately controlled.  Objective: Vital signs in last 24 hours: Temp:  [96.8 F (36 C)-98.4 F (36.9 C)] 98.3 F (36.8 C) (01/16 0544) Pulse Rate:  [53-104] 60 (01/16 0544) Resp:  [10-18] 18 (01/16 0544) BP: (132-169)/(77-96) 141/88 mmHg (01/16 0544) SpO2:  [97 %-100 %] 99 % (01/16 0544) Weight:  [84 kg (185 lb 3 oz)] 84 kg (185 lb 3 oz) (01/15 1524)  Intake/Output from previous day: 01/15 0701 - 01/16 0700 In: 5240 [P.O.:240; I.V.:3900; IV Piggyback:1100] Out: 8527 [Urine:3225; Drains:95; Blood:100] Intake/Output this shift:    Physical Exam:  General: Alert and oriented. CV: RRR Lungs: Clear bilaterally. GI: Soft, Nondistended. Incisions: Dressings intact. Urine: Clear Extremities: Nontender, no erythema, no edema.  Lab Results:  Recent Labs  05/22/13 0910 05/24/13 1302 05/25/13 0507  HGB 13.1 11.3* 11.3*  HCT 38.9* 33.8* 33.5*      Assessment/Plan: POD# 1 s/p robotic prostatectomy.  1) SL IVF 2) Ambulate, Incentive spirometry 3) Transition to oral pain medication 4) Dulcolax suppository 5) D/C pelvic drain 6) Plan for likely discharge later today   Pryor Curia. MD   LOS: 1 day   Dilynn Munroe,LES 05/25/2013, 7:34 AM

## 2013-05-25 NOTE — Care Management Note (Signed)
    Page 1 of 1   05/25/2013     12:48:42 PM   CARE MANAGEMENT NOTE 05/25/2013  Patient:  Richard Delgado, Richard Delgado   Account Number:  [id number]  Date Initiated:  05/25/2013  Documentation initiated by:  Dessa Phi  Subjective/Objective Assessment:   [address] [place].     Action/Plan:   FROM HOME.HAS PCP,PHARMACY.   Anticipated DC Date:  05/25/2013   Anticipated DC Plan:  [place]  CM consult      Choice offered to / List presented to:             Status of service:  In process, will continue to follow Medicare Important Message given?   (If response is "NO", the following Medicare IM given date fields will be blank) Date Medicare IM given:   Date Additional Medicare IM given:    Discharge Disposition:    Per UR Regulation:  Reviewed for med. necessity/level of care/duration of stay  If discussed at [place] of Stay Meetings, dates discussed:    Comments:  05/25/13 Lovett Coffin RN,BSN NCM [phone] POD! [email or web address] ANTICIPATED D/C NEEDS.

## 2015-07-05 ENCOUNTER — Emergency Department (HOSPITAL_BASED_OUTPATIENT_CLINIC_OR_DEPARTMENT_OTHER): Payer: BLUE CROSS/BLUE SHIELD

## 2015-07-05 ENCOUNTER — Encounter (HOSPITAL_BASED_OUTPATIENT_CLINIC_OR_DEPARTMENT_OTHER): Payer: Self-pay | Admitting: Emergency Medicine

## 2015-07-05 ENCOUNTER — Emergency Department (HOSPITAL_BASED_OUTPATIENT_CLINIC_OR_DEPARTMENT_OTHER)
Admission: EM | Admit: 2015-07-05 | Discharge: 2015-07-05 | Disposition: A | Discharge status: Home / Self Care | Payer: BLUE CROSS/BLUE SHIELD | Attending: Emergency Medicine | Admitting: Emergency Medicine

## 2015-07-05 DIAGNOSIS — W271XXA Contact with garden tool, initial encounter: Secondary | ICD-10-CM | POA: Insufficient documentation

## 2015-07-05 DIAGNOSIS — S61411A Laceration without foreign body of right hand, initial encounter: Secondary | ICD-10-CM

## 2015-07-05 DIAGNOSIS — S61011A Laceration without foreign body of right thumb without damage to nail, initial encounter: Secondary | ICD-10-CM | POA: Insufficient documentation

## 2015-07-05 DIAGNOSIS — Y9289 Other specified places as the place of occurrence of the external cause: Secondary | ICD-10-CM | POA: Insufficient documentation

## 2015-07-05 DIAGNOSIS — Y9389 Activity, other specified: Secondary | ICD-10-CM | POA: Diagnosis not present

## 2015-07-05 DIAGNOSIS — Z872 Personal history of diseases of the skin and subcutaneous tissue: Secondary | ICD-10-CM | POA: Insufficient documentation

## 2015-07-05 DIAGNOSIS — Y998 Other external cause status: Secondary | ICD-10-CM | POA: Diagnosis not present

## 2015-07-05 DIAGNOSIS — S62511B Displaced fracture of proximal phalanx of right thumb, initial encounter for open fracture: Secondary | ICD-10-CM | POA: Diagnosis not present

## 2015-07-05 DIAGNOSIS — Z8546 Personal history of malignant neoplasm of prostate: Secondary | ICD-10-CM | POA: Insufficient documentation

## 2015-07-05 DIAGNOSIS — S62609B Fracture of unspecified phalanx of unspecified finger, initial encounter for open fracture: Secondary | ICD-10-CM

## 2015-07-05 DIAGNOSIS — S6991XA Unspecified injury of right wrist, hand and finger(s), initial encounter: Secondary | ICD-10-CM | POA: Diagnosis present

## 2015-07-05 MED ORDER — LIDOCAINE HCL (PF) 1 % IJ SOLN
30.0000 mL | Freq: Once | INTRAMUSCULAR | Status: AC
  Administered 2015-07-05: 5 mL
  Filled 2015-07-05: qty 30

## 2015-07-05 MED ORDER — CEFAZOLIN SODIUM 1-5 GM-% IV SOLN
1.0000 g | Freq: Three times a day (TID) | INTRAVENOUS | Status: DC
  Administered 2015-07-05: 1 g via INTRAVENOUS
  Filled 2015-07-05: qty 50

## 2015-07-05 MED ORDER — DOXYCYCLINE HYCLATE 100 MG PO CAPS: 100.0000 mg | ORAL_CAPSULE | Freq: Two times a day (BID) | ORAL | Status: AC

## 2015-07-05 NOTE — ED Notes (Signed)
Rt thumb placed in sterile water per PA-C orders

## 2015-07-05 NOTE — ED Notes (Signed)
Pt reports laceration to right lateral anterior hand with lawn mower blade, motor was running but blade was not turning and when he touched blade it started turning

## 2015-07-05 NOTE — Discharge Instructions (Signed)
1. Medications: doxycycline, usual home medications 2. Treatment: rest, drink plenty of fluids, wear splint 3. Follow Up: please followup with hand surgery (Dr. Biagio Borg office will call you to arrange follow-up) for discussion of your diagnoses and further evaluation after today's visit; please return to the ER for increased pain, numbness, weakness, redness, new or worsening symptoms    Finger Fracture Finger fractures are breaks in the bones of the fingers. There are many types of fractures. There are also different ways of treating these fractures. Your doctor will talk with you about the best way to treat your fracture. Injury is the main cause of broken fingers. This includes:  Injuries while playing sports.  Workplace injuries.  Falls. HOME CARE  Follow your doctor's instructions for:  Activities.  Exercises.  Physical therapy.  Take medicines only as told by your doctor for pain, discomfort, or fever. GET HELP IF: You have pain or swelling that limits:  The motion of your fingers.  The use of your fingers. GET HELP RIGHT AWAY IF:  You cannot feel your fingers, or your fingers become numb.   This information is not intended to replace advice given to you by your health care provider. Make sure you discuss any questions you have with your health care provider.   Document Released: 10/13/2007 Document Revised: 05/17/2014 Document Reviewed: 12/06/2012 Elsevier Interactive Patient Education 2016 [place], Adult A laceration is a cut that goes through all layers of the skin. The cut also goes into the tissue that is right under the skin. Some cuts heal on their own. Others need to be closed with stitches (sutures), staples, skin adhesive strips, or wound glue. Taking care of your cut lowers your risk of infection and helps your cut to heal better. HOW TO TAKE CARE OF YOUR CUT For stitches or staples:  Keep the wound clean and dry.  If you  were given a bandage (dressing), you should change it at least one time per day or as told by your doctor. You should also change it if it gets wet or dirty.  Keep the wound completely dry for the first 24 hours or as told by your doctor. After that time, you may take a shower or a bath. However, make sure that the wound is not soaked in water until after the stitches or staples have been removed.  Clean the wound one time each day or as told by your doctor:  Wash the wound with soap and water.  Rinse the wound with water until all of the soap comes off.  Pat the wound dry with a clean towel. Do not rub the wound.  After you clean the wound, put a thin layer of antibiotic ointment on it as told by your doctor. This ointment:  Helps to prevent infection.  Keeps the bandage from sticking to the wound.  Have your stitches or staples removed as told by your doctor. If your doctor used skin adhesive strips:   Keep the wound clean and dry.  If you were given a bandage, you should change it at least one time per day or as told by your doctor. You should also change it if it gets dirty or wet.  Do not get the skin adhesive strips wet. You can take a shower or a bath, but be careful to keep the wound dry.  If the wound gets wet, pat it dry with a clean towel. Do not rub the wound.  Skin adhesive  strips fall off on their own. You can trim the strips as the wound heals. Do not remove any strips that are still stuck to the wound. They will fall off after a while. If your doctor used wound glue:  Try to keep your wound dry, but you may briefly wet it in the shower or bath. Do not soak the wound in water, such as by swimming.  After you take a shower or a bath, gently pat the wound dry with a clean towel. Do not rub the wound.  Do not do any activities that will make you really sweaty until the skin glue has fallen off on its own.  Do not apply liquid, cream, or ointment medicine to your  wound while the skin glue is still on.  If you were given a bandage, you should change it at least one time per day or as told by your doctor. You should also change it if it gets dirty or wet.  If a bandage is placed over the wound, do not let the tape for the bandage touch the skin glue.  Do not pick at the glue. The skin glue usually stays on for 5-10 days. Then, it falls off of the skin. General Instructions  To help prevent scarring, make sure to cover your wound with sunscreen whenever you are outside after stitches are removed, after adhesive strips are removed, or when wound glue stays in place and the wound is healed. Make sure to wear a sunscreen of at least 30 SPF.  Take over-the-counter and prescription medicines only as told by your doctor.  If you were given antibiotic medicine or ointment, take or apply it as told by your doctor. Do not stop using the antibiotic even if your wound is getting better.  Do not scratch or pick at the wound.  Keep all follow-up visits as told by your doctor. This is important.  Check your wound every day for signs of infection. Watch for:  Redness, swelling, or pain.  Fluid, blood, or pus.  Raise (elevate) the injured area above the level of your heart while you are sitting or lying down, if possible. GET HELP IF:  You got a tetanus shot and you have any of these problems at the injection site:  Swelling.  Very bad pain.  Redness.  Bleeding.  You have a fever.  A wound that was closed breaks open.  You notice a bad smell coming from your wound or your bandage.  You notice something coming out of the wound, such as wood or glass.  Medicine does not help your pain.  You have more redness, swelling, or pain at the site of your wound.  You have fluid, blood, or pus coming from your wound.  You notice a change in the color of your skin near your wound.  You need to change the bandage often because fluid, blood, or pus is  coming from the wound.  You start to have a new rash.  You start to have numbness around the wound. GET HELP RIGHT AWAY IF:  You have very bad swelling around the wound.  Your pain suddenly gets worse and is very bad.  You notice painful lumps near the wound or on skin that is anywhere on your body.  You have a red streak going away from your wound.  The wound is on your hand or foot and you cannot move a finger or toe like you usually can.  The wound is  on your hand or foot and you notice that your fingers or toes look pale or bluish.   This information is not intended to replace advice given to you by your health care provider. Make sure you discuss any questions you have with your health care provider.   Document Released: 10/13/2007 Document Revised: 09/10/2014 Document Reviewed: 04/22/2014 Elsevier Interactive Patient Education [employer].

## 2015-07-05 NOTE — ED Notes (Signed)
Pt presents with laceration at base of rt thumb from lawn mower blade, no active bleeding noted at this time. Rt thumb slightly swollen and slight ecchymosis noted as well. Sensation and motor WNL of injured digit

## 2015-07-05 NOTE — ED Notes (Signed)
Suture cart to bedside. 

## 2015-07-05 NOTE — ED Notes (Addendum)
[place], [place], made aware of dc BP 162/110, No new orders, states okay to dc pt home.

## 2015-07-05 NOTE — ED Provider Notes (Signed)
CSN: [id number]     Arrival date & time 07/05/15  1341 History   First MD Initiated Contact with Patient 07/05/15 1433     Chief Complaint  Patient presents with  . Laceration    HPI   Richard Delgado is a 60 y.o. male with a PMH of prostate cancer who presents to the ED with right hand laceration. He states he was mowing his lawn when the blade cut his hand. He reports he initially went to urgent care, however was sent to the ED. He reports mild pain, and states movement exacerbates his pain. He has not tried anything for symptom relief. He denies numbness, weakness, paresthesia. He states his tetanus is up to date.   Past Medical History  Diagnosis Date  . Eczema   . Prostate cancer [facility])    Past Surgical History  Procedure Laterality Date  . Knee arthroscopy      rt knee  . Throat surgery  2000    "repair cracked voice box due to injury"  . Robot assisted laparoscopic radical prostatectomy N/A 05/24/2013    Procedure: ROBOTIC ASSISTED LAPAROSCOPIC RADICAL PROSTATECTOMY LEVEL 1;  Surgeon: Dutch Gray, MD;  Location: [facility] ORS;  Service: Urology;  Laterality: N/A;   History reviewed. No pertinent family history. Social History  Substance Use Topics  . Smoking status: Never Smoker   . Smokeless tobacco: None  . Alcohol Use: No    Review of Systems  Skin: Positive for wound.  Neurological: Negative for weakness and numbness.      Allergies  Review of patient's allergies indicates no known allergies.  Home Medications   Prior to Admission medications   Not on File   BP 143/102 mmHg  Pulse 68  Temp(Src) 98.6 F (37 C) (Oral)  Resp 18  Ht 6' (1.829 m)  Wt 81.647 kg  BMI 24.41 kg/m2  SpO2 100% Physical Exam  Constitutional: He is oriented to person, place, and time. He appears well-developed and well-nourished. No distress.  HENT:  Head: Normocephalic and atraumatic.  Right Ear: External ear normal.  Left Ear: External ear normal.  Nose: Nose normal.  Eyes:  Conjunctivae and EOM are normal. Right eye exhibits no discharge. Left eye exhibits no discharge. No scleral icterus.  Neck: Normal range of motion. Neck supple.  Cardiovascular: Normal rate, regular rhythm and intact distal pulses.   Pulmonary/Chest: Effort normal and breath sounds normal. No respiratory distress.  Musculoskeletal: Normal range of motion. He exhibits no edema or tenderness.  Full ROM of right hand and fingers. Distal pulses intact. Cap refill < 3 seconds.  Neurological: He is alert and oriented to person, place, and time.  Strength and sensation intact.  Skin: Skin is warm and dry. He is not diaphoretic.  4 cm laceration to right hand over base of right thumb, hemostatic.   Psychiatric: He has a normal mood and affect. His behavior is normal.  Nursing note and vitals reviewed.   ED Course  .Marland KitchenLaceration Repair Date/Time: 07/05/2015 4:10 PM Performed by: Marella Chimes Authorized by: Bernerd Limbo C Consent: Verbal consent obtained. Risks and benefits: risks, benefits and alternatives were discussed Consent given by: patient Patient understanding: patient states understanding of the procedure being performed Patient consent: the patient's understanding of the procedure matches consent given Procedure consent: procedure consent matches procedure scheduled Relevant documents: relevant documents present and verified Site marked: the operative site was marked Required items: required blood products, implants, devices, and special equipment available Patient identity confirmed:  verbally with patient Time out: Immediately prior to procedure a "time out" was called to verify the correct patient, procedure, equipment, support staff and site/side marked as required. Body area: upper extremity Location details: right thumb Laceration length: 4 cm Foreign bodies: no foreign bodies Tendon involvement: none Nerve involvement: none Vascular damage: no Anesthesia:  local infiltration Local anesthetic: lidocaine 1% without epinephrine Anesthetic total: 5 ml Patient sedated: no Preparation: Patient was prepped and draped in the usual sterile fashion. Irrigation solution: saline Irrigation method: syringe and tap Amount of cleaning: extensive Debridement: none Degree of undermining: none Subcutaneous closure: 5-0 Vicryl Number of sutures: 5 Technique: simple Approximation: loose Approximation difficulty: simple Dressing: 4x4 sterile gauze Patient tolerance: Patient tolerated the procedure well with no immediate complications   Labs Review Labs Reviewed - No data to display  Imaging Review Dg Hand Complete Right  07/05/2015  CLINICAL DATA:  Recent on door injury with laceration and thumb pain, initial encounter EXAM: RIGHT HAND - COMPLETE 3+ VIEW COMPARISON:  None FINDINGS: There is a transverse fracture through the proximal aspect of the first proximal phalanx with mild displacement identified. No other fractures are seen. A soft tissue laceration consistent with the given clinical history is noted. IMPRESSION: First proximal phalangeal fracture with associated soft tissue injury Electronically Signed   By: Inez Catalina M.D.   On: 07/05/2015 15:01    I have personally reviewed and evaluated these images as part of my medical decision-making.   EKG Interpretation None      MDM   Final diagnoses:  Hand laceration, right, initial encounter  Phalanx (hand) fracture, open, initial encounter    60 year old male presents with laceration to his right hand, which he states he sustained today prior to arrival. Notes his tetanus is up-to-date. On exam, patient has a 4 cm laceration to the dorsal aspect of the base of his right thumb, hemostatic. Full range of motion of right thumb. Patient is neurovascularly intact. Imaging of right hand remarkable for first proximal phalangeal fracture with associated soft tissue injury. Hand surgery consulted. Spoke  with Dr. Grandville Silos, who advised closing wound loosely, giving dose of IV abx in the ED, placing in splint, and discharging with antibiotic and close hand follow-up (his office will call the patient). Wound cleaned, irrigated, and repaired. Dose of ancef given in the ED. Will place in splint and discharge with doxycycline. Patient to follow-up with Dr. Grandville Silos as above for further evaluation and management. Strict return precautions discussed. Patient verbalizes his understanding and is in agreement with plan.  BP 143/102 mmHg  Pulse 68  Temp(Src) 98.6 F (37 C) (Oral)  Resp 18  Ht 6' (1.829 m)  Wt 81.647 kg  BMI 24.41 kg/m2  SpO2 100%       Marella Chimes, PA-C 07/05/15 [place], MD 07/06/15 [phone]

## 2015-08-14 DIAGNOSIS — S62511D Displaced fracture of proximal phalanx of right thumb, subsequent encounter for fracture with routine healing: Secondary | ICD-10-CM | POA: Diagnosis not present

## 2015-08-18 DIAGNOSIS — J301 Allergic rhinitis due to pollen: Secondary | ICD-10-CM | POA: Diagnosis not present

## 2015-08-18 DIAGNOSIS — J3081 Allergic rhinitis due to animal (cat) (dog) hair and dander: Secondary | ICD-10-CM | POA: Diagnosis not present

## 2015-08-18 DIAGNOSIS — J3089 Other allergic rhinitis: Secondary | ICD-10-CM | POA: Diagnosis not present

## 2015-08-25 DIAGNOSIS — J3089 Other allergic rhinitis: Secondary | ICD-10-CM | POA: Diagnosis not present

## 2015-08-25 DIAGNOSIS — J301 Allergic rhinitis due to pollen: Secondary | ICD-10-CM | POA: Diagnosis not present

## 2015-08-25 DIAGNOSIS — J3081 Allergic rhinitis due to animal (cat) (dog) hair and dander: Secondary | ICD-10-CM | POA: Diagnosis not present

## 2015-08-27 DIAGNOSIS — J3089 Other allergic rhinitis: Secondary | ICD-10-CM | POA: Diagnosis not present

## 2015-08-27 DIAGNOSIS — J301 Allergic rhinitis due to pollen: Secondary | ICD-10-CM | POA: Diagnosis not present

## 2015-08-27 DIAGNOSIS — J3081 Allergic rhinitis due to animal (cat) (dog) hair and dander: Secondary | ICD-10-CM | POA: Diagnosis not present

## 2015-09-02 DIAGNOSIS — J3089 Other allergic rhinitis: Secondary | ICD-10-CM | POA: Diagnosis not present

## 2015-09-02 DIAGNOSIS — J301 Allergic rhinitis due to pollen: Secondary | ICD-10-CM | POA: Diagnosis not present

## 2015-09-02 DIAGNOSIS — J3081 Allergic rhinitis due to animal (cat) (dog) hair and dander: Secondary | ICD-10-CM | POA: Diagnosis not present

## 2015-09-11 DIAGNOSIS — S61411D Laceration without foreign body of right hand, subsequent encounter: Secondary | ICD-10-CM | POA: Diagnosis not present

## 2015-09-11 DIAGNOSIS — J3089 Other allergic rhinitis: Secondary | ICD-10-CM | POA: Diagnosis not present

## 2015-09-11 DIAGNOSIS — S62511D Displaced fracture of proximal phalanx of right thumb, subsequent encounter for fracture with routine healing: Secondary | ICD-10-CM | POA: Diagnosis not present

## 2015-09-11 DIAGNOSIS — J301 Allergic rhinitis due to pollen: Secondary | ICD-10-CM | POA: Diagnosis not present

## 2015-09-11 DIAGNOSIS — J3081 Allergic rhinitis due to animal (cat) (dog) hair and dander: Secondary | ICD-10-CM | POA: Diagnosis not present

## 2015-09-16 DIAGNOSIS — J301 Allergic rhinitis due to pollen: Secondary | ICD-10-CM | POA: Diagnosis not present

## 2015-09-16 DIAGNOSIS — J3081 Allergic rhinitis due to animal (cat) (dog) hair and dander: Secondary | ICD-10-CM | POA: Diagnosis not present

## 2015-09-16 DIAGNOSIS — J3089 Other allergic rhinitis: Secondary | ICD-10-CM | POA: Diagnosis not present

## 2015-09-18 DIAGNOSIS — J3089 Other allergic rhinitis: Secondary | ICD-10-CM | POA: Diagnosis not present

## 2015-09-18 DIAGNOSIS — J301 Allergic rhinitis due to pollen: Secondary | ICD-10-CM | POA: Diagnosis not present

## 2015-09-18 DIAGNOSIS — J3081 Allergic rhinitis due to animal (cat) (dog) hair and dander: Secondary | ICD-10-CM | POA: Diagnosis not present

## 2015-09-24 DIAGNOSIS — J3089 Other allergic rhinitis: Secondary | ICD-10-CM | POA: Diagnosis not present

## 2015-09-24 DIAGNOSIS — J301 Allergic rhinitis due to pollen: Secondary | ICD-10-CM | POA: Diagnosis not present

## 2015-09-24 DIAGNOSIS — J3081 Allergic rhinitis due to animal (cat) (dog) hair and dander: Secondary | ICD-10-CM | POA: Diagnosis not present

## 2015-09-26 DIAGNOSIS — J301 Allergic rhinitis due to pollen: Secondary | ICD-10-CM | POA: Diagnosis not present

## 2015-09-26 DIAGNOSIS — J3089 Other allergic rhinitis: Secondary | ICD-10-CM | POA: Diagnosis not present

## 2015-10-01 DIAGNOSIS — J3089 Other allergic rhinitis: Secondary | ICD-10-CM | POA: Diagnosis not present

## 2015-10-01 DIAGNOSIS — J3081 Allergic rhinitis due to animal (cat) (dog) hair and dander: Secondary | ICD-10-CM | POA: Diagnosis not present

## 2015-10-01 DIAGNOSIS — J301 Allergic rhinitis due to pollen: Secondary | ICD-10-CM | POA: Diagnosis not present

## 2015-10-03 DIAGNOSIS — J3089 Other allergic rhinitis: Secondary | ICD-10-CM | POA: Diagnosis not present

## 2015-10-03 DIAGNOSIS — J3081 Allergic rhinitis due to animal (cat) (dog) hair and dander: Secondary | ICD-10-CM | POA: Diagnosis not present

## 2015-10-03 DIAGNOSIS — J301 Allergic rhinitis due to pollen: Secondary | ICD-10-CM | POA: Diagnosis not present

## 2015-10-13 DIAGNOSIS — J3081 Allergic rhinitis due to animal (cat) (dog) hair and dander: Secondary | ICD-10-CM | POA: Diagnosis not present

## 2015-10-13 DIAGNOSIS — J301 Allergic rhinitis due to pollen: Secondary | ICD-10-CM | POA: Diagnosis not present

## 2015-10-13 DIAGNOSIS — J3089 Other allergic rhinitis: Secondary | ICD-10-CM | POA: Diagnosis not present

## 2015-10-15 DIAGNOSIS — J3081 Allergic rhinitis due to animal (cat) (dog) hair and dander: Secondary | ICD-10-CM | POA: Diagnosis not present

## 2015-10-15 DIAGNOSIS — J3089 Other allergic rhinitis: Secondary | ICD-10-CM | POA: Diagnosis not present

## 2015-10-15 DIAGNOSIS — J301 Allergic rhinitis due to pollen: Secondary | ICD-10-CM | POA: Diagnosis not present

## 2015-10-21 DIAGNOSIS — J3081 Allergic rhinitis due to animal (cat) (dog) hair and dander: Secondary | ICD-10-CM | POA: Diagnosis not present

## 2015-10-21 DIAGNOSIS — J301 Allergic rhinitis due to pollen: Secondary | ICD-10-CM | POA: Diagnosis not present

## 2015-10-21 DIAGNOSIS — J3089 Other allergic rhinitis: Secondary | ICD-10-CM | POA: Diagnosis not present

## 2015-10-23 DIAGNOSIS — S61411D Laceration without foreign body of right hand, subsequent encounter: Secondary | ICD-10-CM | POA: Diagnosis not present

## 2015-10-28 DIAGNOSIS — J3089 Other allergic rhinitis: Secondary | ICD-10-CM | POA: Diagnosis not present

## 2015-10-28 DIAGNOSIS — J3081 Allergic rhinitis due to animal (cat) (dog) hair and dander: Secondary | ICD-10-CM | POA: Diagnosis not present

## 2015-10-28 DIAGNOSIS — J301 Allergic rhinitis due to pollen: Secondary | ICD-10-CM | POA: Diagnosis not present

## 2015-10-31 DIAGNOSIS — J301 Allergic rhinitis due to pollen: Secondary | ICD-10-CM | POA: Diagnosis not present

## 2015-11-03 DIAGNOSIS — J3089 Other allergic rhinitis: Secondary | ICD-10-CM | POA: Diagnosis not present

## 2015-11-03 DIAGNOSIS — J3081 Allergic rhinitis due to animal (cat) (dog) hair and dander: Secondary | ICD-10-CM | POA: Diagnosis not present

## 2015-11-07 DIAGNOSIS — J3089 Other allergic rhinitis: Secondary | ICD-10-CM | POA: Diagnosis not present

## 2015-11-07 DIAGNOSIS — J3081 Allergic rhinitis due to animal (cat) (dog) hair and dander: Secondary | ICD-10-CM | POA: Diagnosis not present

## 2015-11-07 DIAGNOSIS — J301 Allergic rhinitis due to pollen: Secondary | ICD-10-CM | POA: Diagnosis not present

## 2015-11-13 DIAGNOSIS — J3081 Allergic rhinitis due to animal (cat) (dog) hair and dander: Secondary | ICD-10-CM | POA: Diagnosis not present

## 2015-11-13 DIAGNOSIS — J3089 Other allergic rhinitis: Secondary | ICD-10-CM | POA: Diagnosis not present

## 2015-11-13 DIAGNOSIS — J301 Allergic rhinitis due to pollen: Secondary | ICD-10-CM | POA: Diagnosis not present

## 2015-12-11 DIAGNOSIS — J3081 Allergic rhinitis due to animal (cat) (dog) hair and dander: Secondary | ICD-10-CM | POA: Diagnosis not present

## 2015-12-11 DIAGNOSIS — J3089 Other allergic rhinitis: Secondary | ICD-10-CM | POA: Diagnosis not present

## 2015-12-11 DIAGNOSIS — J301 Allergic rhinitis due to pollen: Secondary | ICD-10-CM | POA: Diagnosis not present

## 2015-12-17 DIAGNOSIS — J301 Allergic rhinitis due to pollen: Secondary | ICD-10-CM | POA: Diagnosis not present

## 2015-12-17 DIAGNOSIS — J3081 Allergic rhinitis due to animal (cat) (dog) hair and dander: Secondary | ICD-10-CM | POA: Diagnosis not present

## 2015-12-17 DIAGNOSIS — J3089 Other allergic rhinitis: Secondary | ICD-10-CM | POA: Diagnosis not present

## 2016-01-09 DIAGNOSIS — J3081 Allergic rhinitis due to animal (cat) (dog) hair and dander: Secondary | ICD-10-CM | POA: Diagnosis not present

## 2016-01-09 DIAGNOSIS — J3089 Other allergic rhinitis: Secondary | ICD-10-CM | POA: Diagnosis not present

## 2016-01-09 DIAGNOSIS — J301 Allergic rhinitis due to pollen: Secondary | ICD-10-CM | POA: Diagnosis not present

## 2016-01-15 DIAGNOSIS — J301 Allergic rhinitis due to pollen: Secondary | ICD-10-CM | POA: Diagnosis not present

## 2016-01-15 DIAGNOSIS — J3081 Allergic rhinitis due to animal (cat) (dog) hair and dander: Secondary | ICD-10-CM | POA: Diagnosis not present

## 2016-01-15 DIAGNOSIS — J3089 Other allergic rhinitis: Secondary | ICD-10-CM | POA: Diagnosis not present

## 2016-01-20 DIAGNOSIS — J3081 Allergic rhinitis due to animal (cat) (dog) hair and dander: Secondary | ICD-10-CM | POA: Diagnosis not present

## 2016-01-20 DIAGNOSIS — J301 Allergic rhinitis due to pollen: Secondary | ICD-10-CM | POA: Diagnosis not present

## 2016-01-20 DIAGNOSIS — J3089 Other allergic rhinitis: Secondary | ICD-10-CM | POA: Diagnosis not present

## 2016-01-30 DIAGNOSIS — J3089 Other allergic rhinitis: Secondary | ICD-10-CM | POA: Diagnosis not present

## 2016-01-30 DIAGNOSIS — J301 Allergic rhinitis due to pollen: Secondary | ICD-10-CM | POA: Diagnosis not present

## 2016-01-30 DIAGNOSIS — Z91030 Bee allergy status: Secondary | ICD-10-CM | POA: Diagnosis not present

## 2016-01-30 DIAGNOSIS — J3081 Allergic rhinitis due to animal (cat) (dog) hair and dander: Secondary | ICD-10-CM | POA: Diagnosis not present

## 2016-02-12 DIAGNOSIS — J3089 Other allergic rhinitis: Secondary | ICD-10-CM | POA: Diagnosis not present

## 2016-02-12 DIAGNOSIS — J301 Allergic rhinitis due to pollen: Secondary | ICD-10-CM | POA: Diagnosis not present

## 2016-02-12 DIAGNOSIS — J3081 Allergic rhinitis due to animal (cat) (dog) hair and dander: Secondary | ICD-10-CM | POA: Diagnosis not present

## 2016-02-19 DIAGNOSIS — J301 Allergic rhinitis due to pollen: Secondary | ICD-10-CM | POA: Diagnosis not present

## 2016-02-19 DIAGNOSIS — J3081 Allergic rhinitis due to animal (cat) (dog) hair and dander: Secondary | ICD-10-CM | POA: Diagnosis not present

## 2016-02-19 DIAGNOSIS — J3089 Other allergic rhinitis: Secondary | ICD-10-CM | POA: Diagnosis not present

## 2016-02-27 DIAGNOSIS — J301 Allergic rhinitis due to pollen: Secondary | ICD-10-CM | POA: Diagnosis not present

## 2016-02-27 DIAGNOSIS — J3089 Other allergic rhinitis: Secondary | ICD-10-CM | POA: Diagnosis not present

## 2016-02-27 DIAGNOSIS — J3081 Allergic rhinitis due to animal (cat) (dog) hair and dander: Secondary | ICD-10-CM | POA: Diagnosis not present

## 2016-03-03 DIAGNOSIS — J3089 Other allergic rhinitis: Secondary | ICD-10-CM | POA: Diagnosis not present

## 2016-03-03 DIAGNOSIS — J301 Allergic rhinitis due to pollen: Secondary | ICD-10-CM | POA: Diagnosis not present

## 2016-03-10 DIAGNOSIS — J3081 Allergic rhinitis due to animal (cat) (dog) hair and dander: Secondary | ICD-10-CM | POA: Diagnosis not present

## 2016-03-10 DIAGNOSIS — J301 Allergic rhinitis due to pollen: Secondary | ICD-10-CM | POA: Diagnosis not present

## 2016-03-10 DIAGNOSIS — J3089 Other allergic rhinitis: Secondary | ICD-10-CM | POA: Diagnosis not present

## 2016-03-16 DIAGNOSIS — J301 Allergic rhinitis due to pollen: Secondary | ICD-10-CM | POA: Diagnosis not present

## 2016-03-16 DIAGNOSIS — J3089 Other allergic rhinitis: Secondary | ICD-10-CM | POA: Diagnosis not present

## 2016-03-16 DIAGNOSIS — J3081 Allergic rhinitis due to animal (cat) (dog) hair and dander: Secondary | ICD-10-CM | POA: Diagnosis not present

## 2016-03-29 DIAGNOSIS — J3081 Allergic rhinitis due to animal (cat) (dog) hair and dander: Secondary | ICD-10-CM | POA: Diagnosis not present

## 2016-03-29 DIAGNOSIS — J3089 Other allergic rhinitis: Secondary | ICD-10-CM | POA: Diagnosis not present

## 2016-03-29 DIAGNOSIS — J301 Allergic rhinitis due to pollen: Secondary | ICD-10-CM | POA: Diagnosis not present

## 2016-04-06 DIAGNOSIS — J3089 Other allergic rhinitis: Secondary | ICD-10-CM | POA: Diagnosis not present

## 2016-04-06 DIAGNOSIS — J3081 Allergic rhinitis due to animal (cat) (dog) hair and dander: Secondary | ICD-10-CM | POA: Diagnosis not present

## 2016-04-06 DIAGNOSIS — J301 Allergic rhinitis due to pollen: Secondary | ICD-10-CM | POA: Diagnosis not present

## 2016-04-14 DIAGNOSIS — D225 Melanocytic nevi of trunk: Secondary | ICD-10-CM | POA: Diagnosis not present

## 2016-04-14 DIAGNOSIS — D485 Neoplasm of uncertain behavior of skin: Secondary | ICD-10-CM | POA: Diagnosis not present

## 2016-04-14 DIAGNOSIS — L821 Other seborrheic keratosis: Secondary | ICD-10-CM | POA: Diagnosis not present

## 2016-04-14 DIAGNOSIS — L308 Other specified dermatitis: Secondary | ICD-10-CM | POA: Diagnosis not present

## 2016-04-15 DIAGNOSIS — J3081 Allergic rhinitis due to animal (cat) (dog) hair and dander: Secondary | ICD-10-CM | POA: Diagnosis not present

## 2016-04-15 DIAGNOSIS — J3089 Other allergic rhinitis: Secondary | ICD-10-CM | POA: Diagnosis not present

## 2016-04-15 DIAGNOSIS — J301 Allergic rhinitis due to pollen: Secondary | ICD-10-CM | POA: Diagnosis not present

## 2016-04-22 DIAGNOSIS — Z23 Encounter for immunization: Secondary | ICD-10-CM | POA: Diagnosis not present

## 2016-04-27 DIAGNOSIS — J3081 Allergic rhinitis due to animal (cat) (dog) hair and dander: Secondary | ICD-10-CM | POA: Diagnosis not present

## 2016-04-27 DIAGNOSIS — D485 Neoplasm of uncertain behavior of skin: Secondary | ICD-10-CM | POA: Diagnosis not present

## 2016-04-27 DIAGNOSIS — J301 Allergic rhinitis due to pollen: Secondary | ICD-10-CM | POA: Diagnosis not present

## 2016-04-27 DIAGNOSIS — J3089 Other allergic rhinitis: Secondary | ICD-10-CM | POA: Diagnosis not present

## 2016-05-06 DIAGNOSIS — J301 Allergic rhinitis due to pollen: Secondary | ICD-10-CM | POA: Diagnosis not present

## 2016-05-06 DIAGNOSIS — J3089 Other allergic rhinitis: Secondary | ICD-10-CM | POA: Diagnosis not present

## 2016-05-06 DIAGNOSIS — J3081 Allergic rhinitis due to animal (cat) (dog) hair and dander: Secondary | ICD-10-CM | POA: Diagnosis not present

## 2016-08-11 DIAGNOSIS — J301 Allergic rhinitis due to pollen: Secondary | ICD-10-CM | POA: Diagnosis not present

## 2016-08-11 DIAGNOSIS — J3081 Allergic rhinitis due to animal (cat) (dog) hair and dander: Secondary | ICD-10-CM | POA: Diagnosis not present

## 2016-08-11 DIAGNOSIS — J3089 Other allergic rhinitis: Secondary | ICD-10-CM | POA: Diagnosis not present

## 2016-08-18 DIAGNOSIS — J3089 Other allergic rhinitis: Secondary | ICD-10-CM | POA: Diagnosis not present

## 2016-08-18 DIAGNOSIS — J301 Allergic rhinitis due to pollen: Secondary | ICD-10-CM | POA: Diagnosis not present

## 2016-08-18 DIAGNOSIS — J3081 Allergic rhinitis due to animal (cat) (dog) hair and dander: Secondary | ICD-10-CM | POA: Diagnosis not present

## 2016-09-20 DIAGNOSIS — H524 Presbyopia: Secondary | ICD-10-CM | POA: Diagnosis not present

## 2016-09-20 DIAGNOSIS — H5210 Myopia, unspecified eye: Secondary | ICD-10-CM | POA: Diagnosis not present

## 2017-02-11 DIAGNOSIS — C61 Malignant neoplasm of prostate: Secondary | ICD-10-CM | POA: Diagnosis not present

## 2017-02-11 DIAGNOSIS — R7303 Prediabetes: Secondary | ICD-10-CM | POA: Diagnosis not present

## 2017-02-11 DIAGNOSIS — M545 Low back pain: Secondary | ICD-10-CM | POA: Diagnosis not present

## 2017-02-11 DIAGNOSIS — F419 Anxiety disorder, unspecified: Secondary | ICD-10-CM | POA: Diagnosis not present

## 2017-02-11 DIAGNOSIS — L309 Dermatitis, unspecified: Secondary | ICD-10-CM | POA: Diagnosis not present

## 2017-02-11 DIAGNOSIS — Z23 Encounter for immunization: Secondary | ICD-10-CM | POA: Diagnosis not present

## 2017-02-11 DIAGNOSIS — E7800 Pure hypercholesterolemia, unspecified: Secondary | ICD-10-CM | POA: Diagnosis not present

## 2017-03-01 DIAGNOSIS — L738 Other specified follicular disorders: Secondary | ICD-10-CM | POA: Diagnosis not present

## 2017-04-05 DIAGNOSIS — L501 Idiopathic urticaria: Secondary | ICD-10-CM | POA: Diagnosis not present

## 2017-04-05 DIAGNOSIS — L209 Atopic dermatitis, unspecified: Secondary | ICD-10-CM | POA: Diagnosis not present

## 2017-04-05 DIAGNOSIS — J3081 Allergic rhinitis due to animal (cat) (dog) hair and dander: Secondary | ICD-10-CM | POA: Diagnosis not present

## 2017-04-05 DIAGNOSIS — J301 Allergic rhinitis due to pollen: Secondary | ICD-10-CM | POA: Diagnosis not present

## 2017-04-05 DIAGNOSIS — J3089 Other allergic rhinitis: Secondary | ICD-10-CM | POA: Diagnosis not present

## 2017-04-06 DIAGNOSIS — M25561 Pain in right knee: Secondary | ICD-10-CM | POA: Diagnosis not present

## 2017-04-06 DIAGNOSIS — M170 Bilateral primary osteoarthritis of knee: Secondary | ICD-10-CM | POA: Diagnosis not present

## 2017-04-06 DIAGNOSIS — G8929 Other chronic pain: Secondary | ICD-10-CM | POA: Diagnosis not present

## 2017-04-14 DIAGNOSIS — J3081 Allergic rhinitis due to animal (cat) (dog) hair and dander: Secondary | ICD-10-CM | POA: Diagnosis not present

## 2017-04-14 DIAGNOSIS — J3089 Other allergic rhinitis: Secondary | ICD-10-CM | POA: Diagnosis not present

## 2017-05-19 DIAGNOSIS — J3089 Other allergic rhinitis: Secondary | ICD-10-CM | POA: Diagnosis not present

## 2017-05-19 DIAGNOSIS — J3081 Allergic rhinitis due to animal (cat) (dog) hair and dander: Secondary | ICD-10-CM | POA: Diagnosis not present

## 2017-05-19 DIAGNOSIS — J301 Allergic rhinitis due to pollen: Secondary | ICD-10-CM | POA: Diagnosis not present

## 2017-06-01 DIAGNOSIS — J3081 Allergic rhinitis due to animal (cat) (dog) hair and dander: Secondary | ICD-10-CM | POA: Diagnosis not present

## 2017-06-01 DIAGNOSIS — J301 Allergic rhinitis due to pollen: Secondary | ICD-10-CM | POA: Diagnosis not present

## 2017-06-01 DIAGNOSIS — J3089 Other allergic rhinitis: Secondary | ICD-10-CM | POA: Diagnosis not present

## 2017-06-08 DIAGNOSIS — J3089 Other allergic rhinitis: Secondary | ICD-10-CM | POA: Diagnosis not present

## 2017-06-08 DIAGNOSIS — J3081 Allergic rhinitis due to animal (cat) (dog) hair and dander: Secondary | ICD-10-CM | POA: Diagnosis not present

## 2017-06-08 DIAGNOSIS — J301 Allergic rhinitis due to pollen: Secondary | ICD-10-CM | POA: Diagnosis not present

## 2017-06-15 DIAGNOSIS — J301 Allergic rhinitis due to pollen: Secondary | ICD-10-CM | POA: Diagnosis not present

## 2017-06-15 DIAGNOSIS — J3081 Allergic rhinitis due to animal (cat) (dog) hair and dander: Secondary | ICD-10-CM | POA: Diagnosis not present

## 2017-06-15 DIAGNOSIS — J3089 Other allergic rhinitis: Secondary | ICD-10-CM | POA: Diagnosis not present

## 2017-06-20 DIAGNOSIS — J301 Allergic rhinitis due to pollen: Secondary | ICD-10-CM | POA: Diagnosis not present

## 2017-06-20 DIAGNOSIS — J3081 Allergic rhinitis due to animal (cat) (dog) hair and dander: Secondary | ICD-10-CM | POA: Diagnosis not present

## 2017-06-20 DIAGNOSIS — J3089 Other allergic rhinitis: Secondary | ICD-10-CM | POA: Diagnosis not present

## 2017-06-22 DIAGNOSIS — J3089 Other allergic rhinitis: Secondary | ICD-10-CM | POA: Diagnosis not present

## 2017-06-22 DIAGNOSIS — J3081 Allergic rhinitis due to animal (cat) (dog) hair and dander: Secondary | ICD-10-CM | POA: Diagnosis not present

## 2017-06-22 DIAGNOSIS — J301 Allergic rhinitis due to pollen: Secondary | ICD-10-CM | POA: Diagnosis not present

## 2017-07-05 DIAGNOSIS — J3081 Allergic rhinitis due to animal (cat) (dog) hair and dander: Secondary | ICD-10-CM | POA: Diagnosis not present

## 2017-07-05 DIAGNOSIS — J3089 Other allergic rhinitis: Secondary | ICD-10-CM | POA: Diagnosis not present

## 2017-07-05 DIAGNOSIS — J301 Allergic rhinitis due to pollen: Secondary | ICD-10-CM | POA: Diagnosis not present

## 2017-07-14 DIAGNOSIS — J3081 Allergic rhinitis due to animal (cat) (dog) hair and dander: Secondary | ICD-10-CM | POA: Diagnosis not present

## 2017-07-14 DIAGNOSIS — J3089 Other allergic rhinitis: Secondary | ICD-10-CM | POA: Diagnosis not present

## 2017-07-14 DIAGNOSIS — J301 Allergic rhinitis due to pollen: Secondary | ICD-10-CM | POA: Diagnosis not present

## 2017-07-21 DIAGNOSIS — J3081 Allergic rhinitis due to animal (cat) (dog) hair and dander: Secondary | ICD-10-CM | POA: Diagnosis not present

## 2017-07-21 DIAGNOSIS — J3089 Other allergic rhinitis: Secondary | ICD-10-CM | POA: Diagnosis not present

## 2017-07-21 DIAGNOSIS — J301 Allergic rhinitis due to pollen: Secondary | ICD-10-CM | POA: Diagnosis not present

## 2017-07-26 DIAGNOSIS — J301 Allergic rhinitis due to pollen: Secondary | ICD-10-CM | POA: Diagnosis not present

## 2017-07-26 DIAGNOSIS — J3081 Allergic rhinitis due to animal (cat) (dog) hair and dander: Secondary | ICD-10-CM | POA: Diagnosis not present

## 2017-07-26 DIAGNOSIS — J3089 Other allergic rhinitis: Secondary | ICD-10-CM | POA: Diagnosis not present

## 2017-08-04 DIAGNOSIS — J301 Allergic rhinitis due to pollen: Secondary | ICD-10-CM | POA: Diagnosis not present

## 2017-08-04 DIAGNOSIS — J3089 Other allergic rhinitis: Secondary | ICD-10-CM | POA: Diagnosis not present

## 2017-08-04 DIAGNOSIS — J3081 Allergic rhinitis due to animal (cat) (dog) hair and dander: Secondary | ICD-10-CM | POA: Diagnosis not present

## 2017-08-11 DIAGNOSIS — J3089 Other allergic rhinitis: Secondary | ICD-10-CM | POA: Diagnosis not present

## 2017-08-11 DIAGNOSIS — J3081 Allergic rhinitis due to animal (cat) (dog) hair and dander: Secondary | ICD-10-CM | POA: Diagnosis not present

## 2017-08-11 DIAGNOSIS — J301 Allergic rhinitis due to pollen: Secondary | ICD-10-CM | POA: Diagnosis not present

## 2017-08-18 DIAGNOSIS — J3089 Other allergic rhinitis: Secondary | ICD-10-CM | POA: Diagnosis not present

## 2017-08-18 DIAGNOSIS — J3081 Allergic rhinitis due to animal (cat) (dog) hair and dander: Secondary | ICD-10-CM | POA: Diagnosis not present

## 2017-08-18 DIAGNOSIS — J301 Allergic rhinitis due to pollen: Secondary | ICD-10-CM | POA: Diagnosis not present

## 2017-08-24 DIAGNOSIS — J3081 Allergic rhinitis due to animal (cat) (dog) hair and dander: Secondary | ICD-10-CM | POA: Diagnosis not present

## 2017-08-24 DIAGNOSIS — J3089 Other allergic rhinitis: Secondary | ICD-10-CM | POA: Diagnosis not present

## 2017-08-24 DIAGNOSIS — J301 Allergic rhinitis due to pollen: Secondary | ICD-10-CM | POA: Diagnosis not present

## 2017-09-06 DIAGNOSIS — J301 Allergic rhinitis due to pollen: Secondary | ICD-10-CM | POA: Diagnosis not present

## 2017-09-06 DIAGNOSIS — J3089 Other allergic rhinitis: Secondary | ICD-10-CM | POA: Diagnosis not present

## 2017-09-06 DIAGNOSIS — J3081 Allergic rhinitis due to animal (cat) (dog) hair and dander: Secondary | ICD-10-CM | POA: Diagnosis not present

## 2017-09-14 DIAGNOSIS — J3089 Other allergic rhinitis: Secondary | ICD-10-CM | POA: Diagnosis not present

## 2017-09-14 DIAGNOSIS — J3081 Allergic rhinitis due to animal (cat) (dog) hair and dander: Secondary | ICD-10-CM | POA: Diagnosis not present

## 2017-09-14 DIAGNOSIS — J301 Allergic rhinitis due to pollen: Secondary | ICD-10-CM | POA: Diagnosis not present

## 2017-09-21 DIAGNOSIS — J3089 Other allergic rhinitis: Secondary | ICD-10-CM | POA: Diagnosis not present

## 2017-09-21 DIAGNOSIS — J301 Allergic rhinitis due to pollen: Secondary | ICD-10-CM | POA: Diagnosis not present

## 2017-09-21 DIAGNOSIS — J3081 Allergic rhinitis due to animal (cat) (dog) hair and dander: Secondary | ICD-10-CM | POA: Diagnosis not present

## 2017-09-28 DIAGNOSIS — J3089 Other allergic rhinitis: Secondary | ICD-10-CM | POA: Diagnosis not present

## 2017-09-28 DIAGNOSIS — J3081 Allergic rhinitis due to animal (cat) (dog) hair and dander: Secondary | ICD-10-CM | POA: Diagnosis not present

## 2017-09-28 DIAGNOSIS — J301 Allergic rhinitis due to pollen: Secondary | ICD-10-CM | POA: Diagnosis not present

## 2017-11-02 DIAGNOSIS — J3089 Other allergic rhinitis: Secondary | ICD-10-CM | POA: Diagnosis not present

## 2017-11-02 DIAGNOSIS — J3081 Allergic rhinitis due to animal (cat) (dog) hair and dander: Secondary | ICD-10-CM | POA: Diagnosis not present

## 2017-11-02 DIAGNOSIS — J301 Allergic rhinitis due to pollen: Secondary | ICD-10-CM | POA: Diagnosis not present

## 2017-11-22 DIAGNOSIS — J3089 Other allergic rhinitis: Secondary | ICD-10-CM | POA: Diagnosis not present

## 2017-11-22 DIAGNOSIS — J3081 Allergic rhinitis due to animal (cat) (dog) hair and dander: Secondary | ICD-10-CM | POA: Diagnosis not present

## 2017-11-22 DIAGNOSIS — J301 Allergic rhinitis due to pollen: Secondary | ICD-10-CM | POA: Diagnosis not present

## 2017-11-30 DIAGNOSIS — J301 Allergic rhinitis due to pollen: Secondary | ICD-10-CM | POA: Diagnosis not present

## 2017-11-30 DIAGNOSIS — J3081 Allergic rhinitis due to animal (cat) (dog) hair and dander: Secondary | ICD-10-CM | POA: Diagnosis not present

## 2017-11-30 DIAGNOSIS — J3089 Other allergic rhinitis: Secondary | ICD-10-CM | POA: Diagnosis not present

## 2017-12-13 DIAGNOSIS — J3089 Other allergic rhinitis: Secondary | ICD-10-CM | POA: Diagnosis not present

## 2017-12-13 DIAGNOSIS — J3081 Allergic rhinitis due to animal (cat) (dog) hair and dander: Secondary | ICD-10-CM | POA: Diagnosis not present

## 2017-12-13 DIAGNOSIS — J301 Allergic rhinitis due to pollen: Secondary | ICD-10-CM | POA: Diagnosis not present

## 2017-12-21 DIAGNOSIS — J3089 Other allergic rhinitis: Secondary | ICD-10-CM | POA: Diagnosis not present

## 2017-12-21 DIAGNOSIS — J301 Allergic rhinitis due to pollen: Secondary | ICD-10-CM | POA: Diagnosis not present

## 2017-12-21 DIAGNOSIS — J3081 Allergic rhinitis due to animal (cat) (dog) hair and dander: Secondary | ICD-10-CM | POA: Diagnosis not present

## 2017-12-26 DIAGNOSIS — J3081 Allergic rhinitis due to animal (cat) (dog) hair and dander: Secondary | ICD-10-CM | POA: Diagnosis not present

## 2017-12-26 DIAGNOSIS — J3089 Other allergic rhinitis: Secondary | ICD-10-CM | POA: Diagnosis not present

## 2017-12-26 DIAGNOSIS — J301 Allergic rhinitis due to pollen: Secondary | ICD-10-CM | POA: Diagnosis not present

## 2018-01-12 DIAGNOSIS — J3089 Other allergic rhinitis: Secondary | ICD-10-CM | POA: Diagnosis not present

## 2018-01-12 DIAGNOSIS — J3081 Allergic rhinitis due to animal (cat) (dog) hair and dander: Secondary | ICD-10-CM | POA: Diagnosis not present

## 2018-01-12 DIAGNOSIS — J301 Allergic rhinitis due to pollen: Secondary | ICD-10-CM | POA: Diagnosis not present

## 2018-01-26 DIAGNOSIS — J3081 Allergic rhinitis due to animal (cat) (dog) hair and dander: Secondary | ICD-10-CM | POA: Diagnosis not present

## 2018-01-26 DIAGNOSIS — J3089 Other allergic rhinitis: Secondary | ICD-10-CM | POA: Diagnosis not present

## 2018-01-26 DIAGNOSIS — J301 Allergic rhinitis due to pollen: Secondary | ICD-10-CM | POA: Diagnosis not present

## 2018-01-31 DIAGNOSIS — J3081 Allergic rhinitis due to animal (cat) (dog) hair and dander: Secondary | ICD-10-CM | POA: Diagnosis not present

## 2018-01-31 DIAGNOSIS — J3089 Other allergic rhinitis: Secondary | ICD-10-CM | POA: Diagnosis not present

## 2018-01-31 DIAGNOSIS — J301 Allergic rhinitis due to pollen: Secondary | ICD-10-CM | POA: Diagnosis not present

## 2018-02-07 DIAGNOSIS — J3081 Allergic rhinitis due to animal (cat) (dog) hair and dander: Secondary | ICD-10-CM | POA: Diagnosis not present

## 2018-02-07 DIAGNOSIS — J3089 Other allergic rhinitis: Secondary | ICD-10-CM | POA: Diagnosis not present

## 2018-02-07 DIAGNOSIS — J301 Allergic rhinitis due to pollen: Secondary | ICD-10-CM | POA: Diagnosis not present

## 2018-02-16 DIAGNOSIS — J301 Allergic rhinitis due to pollen: Secondary | ICD-10-CM | POA: Diagnosis not present

## 2018-02-16 DIAGNOSIS — J3081 Allergic rhinitis due to animal (cat) (dog) hair and dander: Secondary | ICD-10-CM | POA: Diagnosis not present

## 2018-02-16 DIAGNOSIS — J3089 Other allergic rhinitis: Secondary | ICD-10-CM | POA: Diagnosis not present

## 2018-03-02 DIAGNOSIS — J3089 Other allergic rhinitis: Secondary | ICD-10-CM | POA: Diagnosis not present

## 2018-03-02 DIAGNOSIS — J3081 Allergic rhinitis due to animal (cat) (dog) hair and dander: Secondary | ICD-10-CM | POA: Diagnosis not present

## 2018-03-02 DIAGNOSIS — J301 Allergic rhinitis due to pollen: Secondary | ICD-10-CM | POA: Diagnosis not present

## 2018-03-07 DIAGNOSIS — M25561 Pain in right knee: Secondary | ICD-10-CM | POA: Diagnosis not present

## 2018-03-07 DIAGNOSIS — S83241A Other tear of medial meniscus, current injury, right knee, initial encounter: Secondary | ICD-10-CM | POA: Diagnosis not present

## 2018-03-09 DIAGNOSIS — J3089 Other allergic rhinitis: Secondary | ICD-10-CM | POA: Diagnosis not present

## 2018-03-09 DIAGNOSIS — J301 Allergic rhinitis due to pollen: Secondary | ICD-10-CM | POA: Diagnosis not present

## 2018-03-09 DIAGNOSIS — J3081 Allergic rhinitis due to animal (cat) (dog) hair and dander: Secondary | ICD-10-CM | POA: Diagnosis not present

## 2018-03-30 DIAGNOSIS — J3089 Other allergic rhinitis: Secondary | ICD-10-CM | POA: Diagnosis not present

## 2018-03-30 DIAGNOSIS — J301 Allergic rhinitis due to pollen: Secondary | ICD-10-CM | POA: Diagnosis not present

## 2018-03-30 DIAGNOSIS — J3081 Allergic rhinitis due to animal (cat) (dog) hair and dander: Secondary | ICD-10-CM | POA: Diagnosis not present

## 2018-04-04 DIAGNOSIS — J3081 Allergic rhinitis due to animal (cat) (dog) hair and dander: Secondary | ICD-10-CM | POA: Diagnosis not present

## 2018-04-04 DIAGNOSIS — J3089 Other allergic rhinitis: Secondary | ICD-10-CM | POA: Diagnosis not present

## 2018-04-04 DIAGNOSIS — J301 Allergic rhinitis due to pollen: Secondary | ICD-10-CM | POA: Diagnosis not present

## 2018-04-05 DIAGNOSIS — J3089 Other allergic rhinitis: Secondary | ICD-10-CM | POA: Diagnosis not present

## 2018-04-05 DIAGNOSIS — J3081 Allergic rhinitis due to animal (cat) (dog) hair and dander: Secondary | ICD-10-CM | POA: Diagnosis not present

## 2018-04-13 DIAGNOSIS — J3081 Allergic rhinitis due to animal (cat) (dog) hair and dander: Secondary | ICD-10-CM | POA: Diagnosis not present

## 2018-04-13 DIAGNOSIS — J301 Allergic rhinitis due to pollen: Secondary | ICD-10-CM | POA: Diagnosis not present

## 2018-04-13 DIAGNOSIS — J3089 Other allergic rhinitis: Secondary | ICD-10-CM | POA: Diagnosis not present

## 2018-04-19 DIAGNOSIS — J3081 Allergic rhinitis due to animal (cat) (dog) hair and dander: Secondary | ICD-10-CM | POA: Diagnosis not present

## 2018-04-19 DIAGNOSIS — J3089 Other allergic rhinitis: Secondary | ICD-10-CM | POA: Diagnosis not present

## 2018-04-19 DIAGNOSIS — J301 Allergic rhinitis due to pollen: Secondary | ICD-10-CM | POA: Diagnosis not present

## 2018-04-27 DIAGNOSIS — J3089 Other allergic rhinitis: Secondary | ICD-10-CM | POA: Diagnosis not present

## 2018-04-27 DIAGNOSIS — J301 Allergic rhinitis due to pollen: Secondary | ICD-10-CM | POA: Diagnosis not present

## 2018-04-27 DIAGNOSIS — J3081 Allergic rhinitis due to animal (cat) (dog) hair and dander: Secondary | ICD-10-CM | POA: Diagnosis not present

## 2018-05-16 DIAGNOSIS — J301 Allergic rhinitis due to pollen: Secondary | ICD-10-CM | POA: Diagnosis not present

## 2018-05-16 DIAGNOSIS — J3081 Allergic rhinitis due to animal (cat) (dog) hair and dander: Secondary | ICD-10-CM | POA: Diagnosis not present

## 2018-05-16 DIAGNOSIS — J3089 Other allergic rhinitis: Secondary | ICD-10-CM | POA: Diagnosis not present

## 2018-05-18 DIAGNOSIS — J3089 Other allergic rhinitis: Secondary | ICD-10-CM | POA: Diagnosis not present

## 2018-05-18 DIAGNOSIS — J3081 Allergic rhinitis due to animal (cat) (dog) hair and dander: Secondary | ICD-10-CM | POA: Diagnosis not present

## 2018-05-18 DIAGNOSIS — J301 Allergic rhinitis due to pollen: Secondary | ICD-10-CM | POA: Diagnosis not present

## 2018-05-24 DIAGNOSIS — J3081 Allergic rhinitis due to animal (cat) (dog) hair and dander: Secondary | ICD-10-CM | POA: Diagnosis not present

## 2018-05-24 DIAGNOSIS — J301 Allergic rhinitis due to pollen: Secondary | ICD-10-CM | POA: Diagnosis not present

## 2018-05-24 DIAGNOSIS — J3089 Other allergic rhinitis: Secondary | ICD-10-CM | POA: Diagnosis not present

## 2018-06-01 DIAGNOSIS — J3081 Allergic rhinitis due to animal (cat) (dog) hair and dander: Secondary | ICD-10-CM | POA: Diagnosis not present

## 2018-06-01 DIAGNOSIS — J301 Allergic rhinitis due to pollen: Secondary | ICD-10-CM | POA: Diagnosis not present

## 2018-06-01 DIAGNOSIS — J3089 Other allergic rhinitis: Secondary | ICD-10-CM | POA: Diagnosis not present

## 2018-07-13 DIAGNOSIS — J301 Allergic rhinitis due to pollen: Secondary | ICD-10-CM | POA: Diagnosis not present

## 2018-07-13 DIAGNOSIS — J3089 Other allergic rhinitis: Secondary | ICD-10-CM | POA: Diagnosis not present

## 2018-07-13 DIAGNOSIS — J3081 Allergic rhinitis due to animal (cat) (dog) hair and dander: Secondary | ICD-10-CM | POA: Diagnosis not present

## 2018-07-28 DIAGNOSIS — E7800 Pure hypercholesterolemia, unspecified: Secondary | ICD-10-CM | POA: Diagnosis not present

## 2018-07-28 DIAGNOSIS — Z1159 Encounter for screening for other viral diseases: Secondary | ICD-10-CM | POA: Diagnosis not present

## 2018-07-28 DIAGNOSIS — R7303 Prediabetes: Secondary | ICD-10-CM | POA: Diagnosis not present

## 2018-07-28 DIAGNOSIS — Z0000 Encounter for general adult medical examination without abnormal findings: Secondary | ICD-10-CM | POA: Diagnosis not present

## 2018-07-28 DIAGNOSIS — Z8546 Personal history of malignant neoplasm of prostate: Secondary | ICD-10-CM | POA: Diagnosis not present

## 2018-10-19 DIAGNOSIS — M25561 Pain in right knee: Secondary | ICD-10-CM | POA: Diagnosis not present

## 2018-10-25 DIAGNOSIS — M1711 Unilateral primary osteoarthritis, right knee: Secondary | ICD-10-CM | POA: Diagnosis not present

## 2018-10-25 DIAGNOSIS — Z9889 Other specified postprocedural states: Secondary | ICD-10-CM | POA: Diagnosis not present

## 2018-10-26 DIAGNOSIS — M25562 Pain in left knee: Secondary | ICD-10-CM | POA: Diagnosis not present

## 2018-10-26 DIAGNOSIS — M1711 Unilateral primary osteoarthritis, right knee: Secondary | ICD-10-CM | POA: Diagnosis not present

## 2018-10-26 DIAGNOSIS — M1712 Unilateral primary osteoarthritis, left knee: Secondary | ICD-10-CM | POA: Diagnosis not present

## 2018-10-26 DIAGNOSIS — M170 Bilateral primary osteoarthritis of knee: Secondary | ICD-10-CM | POA: Diagnosis not present

## 2019-01-23 DIAGNOSIS — J301 Allergic rhinitis due to pollen: Secondary | ICD-10-CM | POA: Diagnosis not present

## 2019-01-23 DIAGNOSIS — J3089 Other allergic rhinitis: Secondary | ICD-10-CM | POA: Diagnosis not present

## 2019-01-23 DIAGNOSIS — J3081 Allergic rhinitis due to animal (cat) (dog) hair and dander: Secondary | ICD-10-CM | POA: Diagnosis not present

## 2019-01-30 DIAGNOSIS — J301 Allergic rhinitis due to pollen: Secondary | ICD-10-CM | POA: Diagnosis not present

## 2019-01-31 DIAGNOSIS — J3081 Allergic rhinitis due to animal (cat) (dog) hair and dander: Secondary | ICD-10-CM | POA: Diagnosis not present

## 2019-01-31 DIAGNOSIS — J3089 Other allergic rhinitis: Secondary | ICD-10-CM | POA: Diagnosis not present

## 2019-02-07 DIAGNOSIS — J3081 Allergic rhinitis due to animal (cat) (dog) hair and dander: Secondary | ICD-10-CM | POA: Diagnosis not present

## 2019-02-07 DIAGNOSIS — J301 Allergic rhinitis due to pollen: Secondary | ICD-10-CM | POA: Diagnosis not present

## 2019-02-07 DIAGNOSIS — J3089 Other allergic rhinitis: Secondary | ICD-10-CM | POA: Diagnosis not present

## 2019-02-12 DIAGNOSIS — J301 Allergic rhinitis due to pollen: Secondary | ICD-10-CM | POA: Diagnosis not present

## 2019-02-12 DIAGNOSIS — J3089 Other allergic rhinitis: Secondary | ICD-10-CM | POA: Diagnosis not present

## 2019-02-12 DIAGNOSIS — Z23 Encounter for immunization: Secondary | ICD-10-CM | POA: Diagnosis not present

## 2019-02-12 DIAGNOSIS — J3081 Allergic rhinitis due to animal (cat) (dog) hair and dander: Secondary | ICD-10-CM | POA: Diagnosis not present

## 2019-02-14 DIAGNOSIS — J3081 Allergic rhinitis due to animal (cat) (dog) hair and dander: Secondary | ICD-10-CM | POA: Diagnosis not present

## 2019-02-14 DIAGNOSIS — J301 Allergic rhinitis due to pollen: Secondary | ICD-10-CM | POA: Diagnosis not present

## 2019-02-14 DIAGNOSIS — J3089 Other allergic rhinitis: Secondary | ICD-10-CM | POA: Diagnosis not present

## 2019-02-19 DIAGNOSIS — J3081 Allergic rhinitis due to animal (cat) (dog) hair and dander: Secondary | ICD-10-CM | POA: Diagnosis not present

## 2019-02-19 DIAGNOSIS — J301 Allergic rhinitis due to pollen: Secondary | ICD-10-CM | POA: Diagnosis not present

## 2019-02-19 DIAGNOSIS — J3089 Other allergic rhinitis: Secondary | ICD-10-CM | POA: Diagnosis not present

## 2019-02-21 DIAGNOSIS — J3089 Other allergic rhinitis: Secondary | ICD-10-CM | POA: Diagnosis not present

## 2019-02-21 DIAGNOSIS — J301 Allergic rhinitis due to pollen: Secondary | ICD-10-CM | POA: Diagnosis not present

## 2019-02-21 DIAGNOSIS — J3081 Allergic rhinitis due to animal (cat) (dog) hair and dander: Secondary | ICD-10-CM | POA: Diagnosis not present

## 2019-02-27 DIAGNOSIS — J301 Allergic rhinitis due to pollen: Secondary | ICD-10-CM | POA: Diagnosis not present

## 2019-02-27 DIAGNOSIS — J3089 Other allergic rhinitis: Secondary | ICD-10-CM | POA: Diagnosis not present

## 2019-02-27 DIAGNOSIS — J3081 Allergic rhinitis due to animal (cat) (dog) hair and dander: Secondary | ICD-10-CM | POA: Diagnosis not present

## 2019-03-16 ENCOUNTER — Other Ambulatory Visit: Payer: Self-pay

## 2019-03-16 DIAGNOSIS — Z20822 Contact with and (suspected) exposure to covid-19: Secondary | ICD-10-CM

## 2019-03-17 LAB — NOVEL CORONAVIRUS, NAA: SARS-CoV-2, NAA: NOT DETECTED

## 2019-03-21 DIAGNOSIS — Z20828 Contact with and (suspected) exposure to other viral communicable diseases: Secondary | ICD-10-CM | POA: Diagnosis not present

## 2019-04-02 DIAGNOSIS — J301 Allergic rhinitis due to pollen: Secondary | ICD-10-CM | POA: Diagnosis not present

## 2019-04-02 DIAGNOSIS — J3089 Other allergic rhinitis: Secondary | ICD-10-CM | POA: Diagnosis not present

## 2019-04-02 DIAGNOSIS — J3081 Allergic rhinitis due to animal (cat) (dog) hair and dander: Secondary | ICD-10-CM | POA: Diagnosis not present

## 2019-04-04 DIAGNOSIS — J3089 Other allergic rhinitis: Secondary | ICD-10-CM | POA: Diagnosis not present

## 2019-04-04 DIAGNOSIS — J3081 Allergic rhinitis due to animal (cat) (dog) hair and dander: Secondary | ICD-10-CM | POA: Diagnosis not present

## 2019-04-04 DIAGNOSIS — J301 Allergic rhinitis due to pollen: Secondary | ICD-10-CM | POA: Diagnosis not present

## 2019-04-10 DIAGNOSIS — J301 Allergic rhinitis due to pollen: Secondary | ICD-10-CM | POA: Diagnosis not present

## 2019-04-10 DIAGNOSIS — J3081 Allergic rhinitis due to animal (cat) (dog) hair and dander: Secondary | ICD-10-CM | POA: Diagnosis not present

## 2019-04-10 DIAGNOSIS — J3089 Other allergic rhinitis: Secondary | ICD-10-CM | POA: Diagnosis not present

## 2019-04-12 DIAGNOSIS — J3081 Allergic rhinitis due to animal (cat) (dog) hair and dander: Secondary | ICD-10-CM | POA: Diagnosis not present

## 2019-04-12 DIAGNOSIS — J3089 Other allergic rhinitis: Secondary | ICD-10-CM | POA: Diagnosis not present

## 2019-04-12 DIAGNOSIS — J301 Allergic rhinitis due to pollen: Secondary | ICD-10-CM | POA: Diagnosis not present

## 2019-04-18 DIAGNOSIS — J301 Allergic rhinitis due to pollen: Secondary | ICD-10-CM | POA: Diagnosis not present

## 2019-04-18 DIAGNOSIS — J3081 Allergic rhinitis due to animal (cat) (dog) hair and dander: Secondary | ICD-10-CM | POA: Diagnosis not present

## 2019-04-18 DIAGNOSIS — J3089 Other allergic rhinitis: Secondary | ICD-10-CM | POA: Diagnosis not present

## 2019-04-20 DIAGNOSIS — J3081 Allergic rhinitis due to animal (cat) (dog) hair and dander: Secondary | ICD-10-CM | POA: Diagnosis not present

## 2019-04-20 DIAGNOSIS — J3089 Other allergic rhinitis: Secondary | ICD-10-CM | POA: Diagnosis not present

## 2019-04-20 DIAGNOSIS — J301 Allergic rhinitis due to pollen: Secondary | ICD-10-CM | POA: Diagnosis not present

## 2019-04-23 DIAGNOSIS — J3081 Allergic rhinitis due to animal (cat) (dog) hair and dander: Secondary | ICD-10-CM | POA: Diagnosis not present

## 2019-04-23 DIAGNOSIS — J3089 Other allergic rhinitis: Secondary | ICD-10-CM | POA: Diagnosis not present

## 2019-04-23 DIAGNOSIS — J301 Allergic rhinitis due to pollen: Secondary | ICD-10-CM | POA: Diagnosis not present

## 2019-04-25 DIAGNOSIS — J3081 Allergic rhinitis due to animal (cat) (dog) hair and dander: Secondary | ICD-10-CM | POA: Diagnosis not present

## 2019-04-25 DIAGNOSIS — J3089 Other allergic rhinitis: Secondary | ICD-10-CM | POA: Diagnosis not present

## 2019-04-25 DIAGNOSIS — J301 Allergic rhinitis due to pollen: Secondary | ICD-10-CM | POA: Diagnosis not present

## 2019-04-30 DIAGNOSIS — J3081 Allergic rhinitis due to animal (cat) (dog) hair and dander: Secondary | ICD-10-CM | POA: Diagnosis not present

## 2019-04-30 DIAGNOSIS — J3089 Other allergic rhinitis: Secondary | ICD-10-CM | POA: Diagnosis not present

## 2019-04-30 DIAGNOSIS — J301 Allergic rhinitis due to pollen: Secondary | ICD-10-CM | POA: Diagnosis not present

## 2019-05-02 DIAGNOSIS — J301 Allergic rhinitis due to pollen: Secondary | ICD-10-CM | POA: Diagnosis not present

## 2019-05-02 DIAGNOSIS — J3089 Other allergic rhinitis: Secondary | ICD-10-CM | POA: Diagnosis not present

## 2019-05-02 DIAGNOSIS — J3081 Allergic rhinitis due to animal (cat) (dog) hair and dander: Secondary | ICD-10-CM | POA: Diagnosis not present

## 2019-05-08 DIAGNOSIS — J3081 Allergic rhinitis due to animal (cat) (dog) hair and dander: Secondary | ICD-10-CM | POA: Diagnosis not present

## 2019-05-08 DIAGNOSIS — J3089 Other allergic rhinitis: Secondary | ICD-10-CM | POA: Diagnosis not present

## 2019-05-08 DIAGNOSIS — J301 Allergic rhinitis due to pollen: Secondary | ICD-10-CM | POA: Diagnosis not present

## 2019-05-10 DIAGNOSIS — J301 Allergic rhinitis due to pollen: Secondary | ICD-10-CM | POA: Diagnosis not present

## 2019-05-10 DIAGNOSIS — J3089 Other allergic rhinitis: Secondary | ICD-10-CM | POA: Diagnosis not present

## 2019-05-10 DIAGNOSIS — J3081 Allergic rhinitis due to animal (cat) (dog) hair and dander: Secondary | ICD-10-CM | POA: Diagnosis not present

## 2019-05-16 DIAGNOSIS — J301 Allergic rhinitis due to pollen: Secondary | ICD-10-CM | POA: Diagnosis not present

## 2019-05-16 DIAGNOSIS — J3089 Other allergic rhinitis: Secondary | ICD-10-CM | POA: Diagnosis not present

## 2019-05-16 DIAGNOSIS — J3081 Allergic rhinitis due to animal (cat) (dog) hair and dander: Secondary | ICD-10-CM | POA: Diagnosis not present

## 2019-05-22 DIAGNOSIS — J301 Allergic rhinitis due to pollen: Secondary | ICD-10-CM | POA: Diagnosis not present

## 2019-05-22 DIAGNOSIS — J3089 Other allergic rhinitis: Secondary | ICD-10-CM | POA: Diagnosis not present

## 2019-05-22 DIAGNOSIS — J3081 Allergic rhinitis due to animal (cat) (dog) hair and dander: Secondary | ICD-10-CM | POA: Diagnosis not present

## 2019-05-24 DIAGNOSIS — J3081 Allergic rhinitis due to animal (cat) (dog) hair and dander: Secondary | ICD-10-CM | POA: Diagnosis not present

## 2019-05-24 DIAGNOSIS — J301 Allergic rhinitis due to pollen: Secondary | ICD-10-CM | POA: Diagnosis not present

## 2019-05-24 DIAGNOSIS — J3089 Other allergic rhinitis: Secondary | ICD-10-CM | POA: Diagnosis not present

## 2019-05-29 DIAGNOSIS — J3089 Other allergic rhinitis: Secondary | ICD-10-CM | POA: Diagnosis not present

## 2019-05-29 DIAGNOSIS — J301 Allergic rhinitis due to pollen: Secondary | ICD-10-CM | POA: Diagnosis not present

## 2019-05-29 DIAGNOSIS — J3081 Allergic rhinitis due to animal (cat) (dog) hair and dander: Secondary | ICD-10-CM | POA: Diagnosis not present

## 2019-05-31 DIAGNOSIS — J3081 Allergic rhinitis due to animal (cat) (dog) hair and dander: Secondary | ICD-10-CM | POA: Diagnosis not present

## 2019-05-31 DIAGNOSIS — J3089 Other allergic rhinitis: Secondary | ICD-10-CM | POA: Diagnosis not present

## 2019-05-31 DIAGNOSIS — J301 Allergic rhinitis due to pollen: Secondary | ICD-10-CM | POA: Diagnosis not present

## 2019-06-06 DIAGNOSIS — J3089 Other allergic rhinitis: Secondary | ICD-10-CM | POA: Diagnosis not present

## 2019-06-06 DIAGNOSIS — J3081 Allergic rhinitis due to animal (cat) (dog) hair and dander: Secondary | ICD-10-CM | POA: Diagnosis not present

## 2019-06-06 DIAGNOSIS — J301 Allergic rhinitis due to pollen: Secondary | ICD-10-CM | POA: Diagnosis not present

## 2019-06-13 DIAGNOSIS — J3081 Allergic rhinitis due to animal (cat) (dog) hair and dander: Secondary | ICD-10-CM | POA: Diagnosis not present

## 2019-06-13 DIAGNOSIS — J3089 Other allergic rhinitis: Secondary | ICD-10-CM | POA: Diagnosis not present

## 2019-06-13 DIAGNOSIS — J301 Allergic rhinitis due to pollen: Secondary | ICD-10-CM | POA: Diagnosis not present

## 2019-06-19 DIAGNOSIS — J301 Allergic rhinitis due to pollen: Secondary | ICD-10-CM | POA: Diagnosis not present

## 2019-06-19 DIAGNOSIS — J3081 Allergic rhinitis due to animal (cat) (dog) hair and dander: Secondary | ICD-10-CM | POA: Diagnosis not present

## 2019-06-19 DIAGNOSIS — J3089 Other allergic rhinitis: Secondary | ICD-10-CM | POA: Diagnosis not present

## 2019-07-04 DIAGNOSIS — J3089 Other allergic rhinitis: Secondary | ICD-10-CM | POA: Diagnosis not present

## 2019-07-04 DIAGNOSIS — J3081 Allergic rhinitis due to animal (cat) (dog) hair and dander: Secondary | ICD-10-CM | POA: Diagnosis not present

## 2019-07-04 DIAGNOSIS — J301 Allergic rhinitis due to pollen: Secondary | ICD-10-CM | POA: Diagnosis not present

## 2019-07-12 DIAGNOSIS — J301 Allergic rhinitis due to pollen: Secondary | ICD-10-CM | POA: Diagnosis not present

## 2019-07-12 DIAGNOSIS — J3081 Allergic rhinitis due to animal (cat) (dog) hair and dander: Secondary | ICD-10-CM | POA: Diagnosis not present

## 2019-07-12 DIAGNOSIS — J3089 Other allergic rhinitis: Secondary | ICD-10-CM | POA: Diagnosis not present

## 2019-07-17 DIAGNOSIS — J3081 Allergic rhinitis due to animal (cat) (dog) hair and dander: Secondary | ICD-10-CM | POA: Diagnosis not present

## 2019-07-17 DIAGNOSIS — J301 Allergic rhinitis due to pollen: Secondary | ICD-10-CM | POA: Diagnosis not present

## 2019-07-17 DIAGNOSIS — J3089 Other allergic rhinitis: Secondary | ICD-10-CM | POA: Diagnosis not present

## 2019-07-18 DIAGNOSIS — J3081 Allergic rhinitis due to animal (cat) (dog) hair and dander: Secondary | ICD-10-CM | POA: Diagnosis not present

## 2019-07-18 DIAGNOSIS — J3089 Other allergic rhinitis: Secondary | ICD-10-CM | POA: Diagnosis not present

## 2019-08-06 DIAGNOSIS — J3089 Other allergic rhinitis: Secondary | ICD-10-CM | POA: Diagnosis not present

## 2019-08-06 DIAGNOSIS — J301 Allergic rhinitis due to pollen: Secondary | ICD-10-CM | POA: Diagnosis not present

## 2019-08-06 DIAGNOSIS — J3081 Allergic rhinitis due to animal (cat) (dog) hair and dander: Secondary | ICD-10-CM | POA: Diagnosis not present

## 2019-08-07 DIAGNOSIS — J301 Allergic rhinitis due to pollen: Secondary | ICD-10-CM | POA: Diagnosis not present

## 2019-08-07 DIAGNOSIS — J3089 Other allergic rhinitis: Secondary | ICD-10-CM | POA: Diagnosis not present

## 2019-08-07 DIAGNOSIS — J3081 Allergic rhinitis due to animal (cat) (dog) hair and dander: Secondary | ICD-10-CM | POA: Diagnosis not present

## 2019-08-22 DIAGNOSIS — J3089 Other allergic rhinitis: Secondary | ICD-10-CM | POA: Diagnosis not present

## 2019-08-22 DIAGNOSIS — J301 Allergic rhinitis due to pollen: Secondary | ICD-10-CM | POA: Diagnosis not present

## 2019-08-22 DIAGNOSIS — J3081 Allergic rhinitis due to animal (cat) (dog) hair and dander: Secondary | ICD-10-CM | POA: Diagnosis not present

## 2019-08-29 DIAGNOSIS — J3081 Allergic rhinitis due to animal (cat) (dog) hair and dander: Secondary | ICD-10-CM | POA: Diagnosis not present

## 2019-08-29 DIAGNOSIS — J301 Allergic rhinitis due to pollen: Secondary | ICD-10-CM | POA: Diagnosis not present

## 2019-08-29 DIAGNOSIS — J3089 Other allergic rhinitis: Secondary | ICD-10-CM | POA: Diagnosis not present

## 2019-09-04 DIAGNOSIS — J301 Allergic rhinitis due to pollen: Secondary | ICD-10-CM | POA: Diagnosis not present

## 2019-09-04 DIAGNOSIS — J3089 Other allergic rhinitis: Secondary | ICD-10-CM | POA: Diagnosis not present

## 2019-09-04 DIAGNOSIS — J3081 Allergic rhinitis due to animal (cat) (dog) hair and dander: Secondary | ICD-10-CM | POA: Diagnosis not present

## 2019-09-06 DIAGNOSIS — J3089 Other allergic rhinitis: Secondary | ICD-10-CM | POA: Diagnosis not present

## 2019-09-06 DIAGNOSIS — J3081 Allergic rhinitis due to animal (cat) (dog) hair and dander: Secondary | ICD-10-CM | POA: Diagnosis not present

## 2019-09-06 DIAGNOSIS — J301 Allergic rhinitis due to pollen: Secondary | ICD-10-CM | POA: Diagnosis not present

## 2019-09-12 DIAGNOSIS — J3081 Allergic rhinitis due to animal (cat) (dog) hair and dander: Secondary | ICD-10-CM | POA: Diagnosis not present

## 2019-09-12 DIAGNOSIS — J301 Allergic rhinitis due to pollen: Secondary | ICD-10-CM | POA: Diagnosis not present

## 2019-09-12 DIAGNOSIS — J3089 Other allergic rhinitis: Secondary | ICD-10-CM | POA: Diagnosis not present

## 2019-09-18 DIAGNOSIS — J3089 Other allergic rhinitis: Secondary | ICD-10-CM | POA: Diagnosis not present

## 2019-09-18 DIAGNOSIS — J3081 Allergic rhinitis due to animal (cat) (dog) hair and dander: Secondary | ICD-10-CM | POA: Diagnosis not present

## 2019-09-18 DIAGNOSIS — J301 Allergic rhinitis due to pollen: Secondary | ICD-10-CM | POA: Diagnosis not present

## 2019-09-25 DIAGNOSIS — J301 Allergic rhinitis due to pollen: Secondary | ICD-10-CM | POA: Diagnosis not present

## 2019-09-25 DIAGNOSIS — J3089 Other allergic rhinitis: Secondary | ICD-10-CM | POA: Diagnosis not present

## 2019-09-25 DIAGNOSIS — J3081 Allergic rhinitis due to animal (cat) (dog) hair and dander: Secondary | ICD-10-CM | POA: Diagnosis not present

## 2019-10-02 DIAGNOSIS — J3081 Allergic rhinitis due to animal (cat) (dog) hair and dander: Secondary | ICD-10-CM | POA: Diagnosis not present

## 2019-10-02 DIAGNOSIS — J301 Allergic rhinitis due to pollen: Secondary | ICD-10-CM | POA: Diagnosis not present

## 2019-10-02 DIAGNOSIS — J3089 Other allergic rhinitis: Secondary | ICD-10-CM | POA: Diagnosis not present

## 2019-10-11 DIAGNOSIS — J3089 Other allergic rhinitis: Secondary | ICD-10-CM | POA: Diagnosis not present

## 2019-10-11 DIAGNOSIS — J3081 Allergic rhinitis due to animal (cat) (dog) hair and dander: Secondary | ICD-10-CM | POA: Diagnosis not present

## 2019-10-11 DIAGNOSIS — J301 Allergic rhinitis due to pollen: Secondary | ICD-10-CM | POA: Diagnosis not present

## 2019-10-23 DIAGNOSIS — J3081 Allergic rhinitis due to animal (cat) (dog) hair and dander: Secondary | ICD-10-CM | POA: Diagnosis not present

## 2019-10-23 DIAGNOSIS — J301 Allergic rhinitis due to pollen: Secondary | ICD-10-CM | POA: Diagnosis not present

## 2019-10-23 DIAGNOSIS — J3089 Other allergic rhinitis: Secondary | ICD-10-CM | POA: Diagnosis not present

## 2019-11-01 DIAGNOSIS — J3089 Other allergic rhinitis: Secondary | ICD-10-CM | POA: Diagnosis not present

## 2019-11-01 DIAGNOSIS — J3081 Allergic rhinitis due to animal (cat) (dog) hair and dander: Secondary | ICD-10-CM | POA: Diagnosis not present

## 2019-11-01 DIAGNOSIS — J301 Allergic rhinitis due to pollen: Secondary | ICD-10-CM | POA: Diagnosis not present

## 2019-11-08 DIAGNOSIS — J301 Allergic rhinitis due to pollen: Secondary | ICD-10-CM | POA: Diagnosis not present

## 2019-11-08 DIAGNOSIS — J3089 Other allergic rhinitis: Secondary | ICD-10-CM | POA: Diagnosis not present

## 2019-11-08 DIAGNOSIS — J3081 Allergic rhinitis due to animal (cat) (dog) hair and dander: Secondary | ICD-10-CM | POA: Diagnosis not present

## 2019-11-15 DIAGNOSIS — J3081 Allergic rhinitis due to animal (cat) (dog) hair and dander: Secondary | ICD-10-CM | POA: Diagnosis not present

## 2019-11-15 DIAGNOSIS — J301 Allergic rhinitis due to pollen: Secondary | ICD-10-CM | POA: Diagnosis not present

## 2019-11-15 DIAGNOSIS — J3089 Other allergic rhinitis: Secondary | ICD-10-CM | POA: Diagnosis not present

## 2019-11-19 DIAGNOSIS — J3089 Other allergic rhinitis: Secondary | ICD-10-CM | POA: Diagnosis not present

## 2019-11-19 DIAGNOSIS — J301 Allergic rhinitis due to pollen: Secondary | ICD-10-CM | POA: Diagnosis not present

## 2019-11-19 DIAGNOSIS — J3081 Allergic rhinitis due to animal (cat) (dog) hair and dander: Secondary | ICD-10-CM | POA: Diagnosis not present

## 2019-11-28 DIAGNOSIS — J3081 Allergic rhinitis due to animal (cat) (dog) hair and dander: Secondary | ICD-10-CM | POA: Diagnosis not present

## 2019-11-28 DIAGNOSIS — J301 Allergic rhinitis due to pollen: Secondary | ICD-10-CM | POA: Diagnosis not present

## 2019-11-28 DIAGNOSIS — J3089 Other allergic rhinitis: Secondary | ICD-10-CM | POA: Diagnosis not present

## 2019-12-06 DIAGNOSIS — J301 Allergic rhinitis due to pollen: Secondary | ICD-10-CM | POA: Diagnosis not present

## 2019-12-06 DIAGNOSIS — J3089 Other allergic rhinitis: Secondary | ICD-10-CM | POA: Diagnosis not present

## 2019-12-06 DIAGNOSIS — J3081 Allergic rhinitis due to animal (cat) (dog) hair and dander: Secondary | ICD-10-CM | POA: Diagnosis not present

## 2019-12-20 DIAGNOSIS — J3081 Allergic rhinitis due to animal (cat) (dog) hair and dander: Secondary | ICD-10-CM | POA: Diagnosis not present

## 2019-12-20 DIAGNOSIS — J3089 Other allergic rhinitis: Secondary | ICD-10-CM | POA: Diagnosis not present

## 2019-12-20 DIAGNOSIS — J301 Allergic rhinitis due to pollen: Secondary | ICD-10-CM | POA: Diagnosis not present

## 2019-12-31 DIAGNOSIS — J3089 Other allergic rhinitis: Secondary | ICD-10-CM | POA: Diagnosis not present

## 2019-12-31 DIAGNOSIS — J3081 Allergic rhinitis due to animal (cat) (dog) hair and dander: Secondary | ICD-10-CM | POA: Diagnosis not present

## 2019-12-31 DIAGNOSIS — J301 Allergic rhinitis due to pollen: Secondary | ICD-10-CM | POA: Diagnosis not present

## 2020-01-07 DIAGNOSIS — Z8546 Personal history of malignant neoplasm of prostate: Secondary | ICD-10-CM | POA: Diagnosis not present

## 2020-01-07 DIAGNOSIS — E7800 Pure hypercholesterolemia, unspecified: Secondary | ICD-10-CM | POA: Diagnosis not present

## 2020-01-07 DIAGNOSIS — F419 Anxiety disorder, unspecified: Secondary | ICD-10-CM | POA: Diagnosis not present

## 2020-01-07 DIAGNOSIS — R7303 Prediabetes: Secondary | ICD-10-CM | POA: Diagnosis not present

## 2020-01-15 DIAGNOSIS — J301 Allergic rhinitis due to pollen: Secondary | ICD-10-CM | POA: Diagnosis not present

## 2020-01-15 DIAGNOSIS — J3081 Allergic rhinitis due to animal (cat) (dog) hair and dander: Secondary | ICD-10-CM | POA: Diagnosis not present

## 2020-01-15 DIAGNOSIS — J3089 Other allergic rhinitis: Secondary | ICD-10-CM | POA: Diagnosis not present

## 2020-01-24 DIAGNOSIS — J301 Allergic rhinitis due to pollen: Secondary | ICD-10-CM | POA: Diagnosis not present

## 2020-01-24 DIAGNOSIS — J3081 Allergic rhinitis due to animal (cat) (dog) hair and dander: Secondary | ICD-10-CM | POA: Diagnosis not present

## 2020-01-24 DIAGNOSIS — J3089 Other allergic rhinitis: Secondary | ICD-10-CM | POA: Diagnosis not present

## 2020-01-28 DIAGNOSIS — J3081 Allergic rhinitis due to animal (cat) (dog) hair and dander: Secondary | ICD-10-CM | POA: Diagnosis not present

## 2020-01-28 DIAGNOSIS — J3089 Other allergic rhinitis: Secondary | ICD-10-CM | POA: Diagnosis not present

## 2020-01-28 DIAGNOSIS — J301 Allergic rhinitis due to pollen: Secondary | ICD-10-CM | POA: Diagnosis not present

## 2020-02-06 DIAGNOSIS — J301 Allergic rhinitis due to pollen: Secondary | ICD-10-CM | POA: Diagnosis not present

## 2020-02-06 DIAGNOSIS — J3089 Other allergic rhinitis: Secondary | ICD-10-CM | POA: Diagnosis not present

## 2020-02-06 DIAGNOSIS — J3081 Allergic rhinitis due to animal (cat) (dog) hair and dander: Secondary | ICD-10-CM | POA: Diagnosis not present

## 2020-02-13 DIAGNOSIS — J301 Allergic rhinitis due to pollen: Secondary | ICD-10-CM | POA: Diagnosis not present

## 2020-02-13 DIAGNOSIS — J3081 Allergic rhinitis due to animal (cat) (dog) hair and dander: Secondary | ICD-10-CM | POA: Diagnosis not present

## 2020-02-13 DIAGNOSIS — J3089 Other allergic rhinitis: Secondary | ICD-10-CM | POA: Diagnosis not present

## 2020-02-19 DIAGNOSIS — J3089 Other allergic rhinitis: Secondary | ICD-10-CM | POA: Diagnosis not present

## 2020-02-19 DIAGNOSIS — J3081 Allergic rhinitis due to animal (cat) (dog) hair and dander: Secondary | ICD-10-CM | POA: Diagnosis not present

## 2020-02-19 DIAGNOSIS — J301 Allergic rhinitis due to pollen: Secondary | ICD-10-CM | POA: Diagnosis not present

## 2020-02-25 DIAGNOSIS — J3089 Other allergic rhinitis: Secondary | ICD-10-CM | POA: Diagnosis not present

## 2020-02-25 DIAGNOSIS — J301 Allergic rhinitis due to pollen: Secondary | ICD-10-CM | POA: Diagnosis not present

## 2020-02-25 DIAGNOSIS — J3081 Allergic rhinitis due to animal (cat) (dog) hair and dander: Secondary | ICD-10-CM | POA: Diagnosis not present

## 2020-03-06 DIAGNOSIS — J3089 Other allergic rhinitis: Secondary | ICD-10-CM | POA: Diagnosis not present

## 2020-03-06 DIAGNOSIS — J301 Allergic rhinitis due to pollen: Secondary | ICD-10-CM | POA: Diagnosis not present

## 2020-03-06 DIAGNOSIS — J3081 Allergic rhinitis due to animal (cat) (dog) hair and dander: Secondary | ICD-10-CM | POA: Diagnosis not present

## 2020-03-11 DIAGNOSIS — J3081 Allergic rhinitis due to animal (cat) (dog) hair and dander: Secondary | ICD-10-CM | POA: Diagnosis not present

## 2020-03-11 DIAGNOSIS — J301 Allergic rhinitis due to pollen: Secondary | ICD-10-CM | POA: Diagnosis not present

## 2020-03-11 DIAGNOSIS — J3089 Other allergic rhinitis: Secondary | ICD-10-CM | POA: Diagnosis not present

## 2020-03-20 DIAGNOSIS — J3081 Allergic rhinitis due to animal (cat) (dog) hair and dander: Secondary | ICD-10-CM | POA: Diagnosis not present

## 2020-03-20 DIAGNOSIS — J3089 Other allergic rhinitis: Secondary | ICD-10-CM | POA: Diagnosis not present

## 2020-03-20 DIAGNOSIS — J301 Allergic rhinitis due to pollen: Secondary | ICD-10-CM | POA: Diagnosis not present

## 2020-03-25 DIAGNOSIS — J301 Allergic rhinitis due to pollen: Secondary | ICD-10-CM | POA: Diagnosis not present

## 2020-03-25 DIAGNOSIS — J3089 Other allergic rhinitis: Secondary | ICD-10-CM | POA: Diagnosis not present

## 2020-03-25 DIAGNOSIS — J3081 Allergic rhinitis due to animal (cat) (dog) hair and dander: Secondary | ICD-10-CM | POA: Diagnosis not present

## 2020-04-01 DIAGNOSIS — J3089 Other allergic rhinitis: Secondary | ICD-10-CM | POA: Diagnosis not present

## 2020-04-01 DIAGNOSIS — J301 Allergic rhinitis due to pollen: Secondary | ICD-10-CM | POA: Diagnosis not present

## 2020-04-01 DIAGNOSIS — J3081 Allergic rhinitis due to animal (cat) (dog) hair and dander: Secondary | ICD-10-CM | POA: Diagnosis not present

## 2020-04-08 DIAGNOSIS — J3081 Allergic rhinitis due to animal (cat) (dog) hair and dander: Secondary | ICD-10-CM | POA: Diagnosis not present

## 2020-04-08 DIAGNOSIS — J301 Allergic rhinitis due to pollen: Secondary | ICD-10-CM | POA: Diagnosis not present

## 2020-04-08 DIAGNOSIS — J3089 Other allergic rhinitis: Secondary | ICD-10-CM | POA: Diagnosis not present

## 2020-04-14 DIAGNOSIS — J301 Allergic rhinitis due to pollen: Secondary | ICD-10-CM | POA: Diagnosis not present

## 2020-04-14 DIAGNOSIS — J3081 Allergic rhinitis due to animal (cat) (dog) hair and dander: Secondary | ICD-10-CM | POA: Diagnosis not present

## 2020-04-14 DIAGNOSIS — J3089 Other allergic rhinitis: Secondary | ICD-10-CM | POA: Diagnosis not present

## 2020-04-18 DIAGNOSIS — J301 Allergic rhinitis due to pollen: Secondary | ICD-10-CM | POA: Diagnosis not present

## 2020-04-21 DIAGNOSIS — J3089 Other allergic rhinitis: Secondary | ICD-10-CM | POA: Diagnosis not present

## 2020-04-21 DIAGNOSIS — J3081 Allergic rhinitis due to animal (cat) (dog) hair and dander: Secondary | ICD-10-CM | POA: Diagnosis not present

## 2020-04-24 DIAGNOSIS — J3089 Other allergic rhinitis: Secondary | ICD-10-CM | POA: Diagnosis not present

## 2020-04-24 DIAGNOSIS — J301 Allergic rhinitis due to pollen: Secondary | ICD-10-CM | POA: Diagnosis not present

## 2020-04-24 DIAGNOSIS — J3081 Allergic rhinitis due to animal (cat) (dog) hair and dander: Secondary | ICD-10-CM | POA: Diagnosis not present

## 2020-05-07 DIAGNOSIS — J3081 Allergic rhinitis due to animal (cat) (dog) hair and dander: Secondary | ICD-10-CM | POA: Diagnosis not present

## 2020-05-07 DIAGNOSIS — J3089 Other allergic rhinitis: Secondary | ICD-10-CM | POA: Diagnosis not present

## 2020-05-07 DIAGNOSIS — J301 Allergic rhinitis due to pollen: Secondary | ICD-10-CM | POA: Diagnosis not present

## 2020-05-15 DIAGNOSIS — J3081 Allergic rhinitis due to animal (cat) (dog) hair and dander: Secondary | ICD-10-CM | POA: Diagnosis not present

## 2020-05-15 DIAGNOSIS — J3089 Other allergic rhinitis: Secondary | ICD-10-CM | POA: Diagnosis not present

## 2020-05-15 DIAGNOSIS — J301 Allergic rhinitis due to pollen: Secondary | ICD-10-CM | POA: Diagnosis not present

## 2020-05-22 DIAGNOSIS — J3081 Allergic rhinitis due to animal (cat) (dog) hair and dander: Secondary | ICD-10-CM | POA: Diagnosis not present

## 2020-05-22 DIAGNOSIS — J3089 Other allergic rhinitis: Secondary | ICD-10-CM | POA: Diagnosis not present

## 2020-05-22 DIAGNOSIS — J301 Allergic rhinitis due to pollen: Secondary | ICD-10-CM | POA: Diagnosis not present

## 2020-06-12 DIAGNOSIS — J3081 Allergic rhinitis due to animal (cat) (dog) hair and dander: Secondary | ICD-10-CM | POA: Diagnosis not present

## 2020-06-12 DIAGNOSIS — J3089 Other allergic rhinitis: Secondary | ICD-10-CM | POA: Diagnosis not present

## 2020-06-12 DIAGNOSIS — J301 Allergic rhinitis due to pollen: Secondary | ICD-10-CM | POA: Diagnosis not present

## 2020-06-17 DIAGNOSIS — J3089 Other allergic rhinitis: Secondary | ICD-10-CM | POA: Diagnosis not present

## 2020-06-17 DIAGNOSIS — J3081 Allergic rhinitis due to animal (cat) (dog) hair and dander: Secondary | ICD-10-CM | POA: Diagnosis not present

## 2020-06-17 DIAGNOSIS — J301 Allergic rhinitis due to pollen: Secondary | ICD-10-CM | POA: Diagnosis not present

## 2020-06-27 DIAGNOSIS — J3089 Other allergic rhinitis: Secondary | ICD-10-CM | POA: Diagnosis not present

## 2020-06-27 DIAGNOSIS — J301 Allergic rhinitis due to pollen: Secondary | ICD-10-CM | POA: Diagnosis not present

## 2020-06-27 DIAGNOSIS — J3081 Allergic rhinitis due to animal (cat) (dog) hair and dander: Secondary | ICD-10-CM | POA: Diagnosis not present

## 2020-07-01 DIAGNOSIS — J3089 Other allergic rhinitis: Secondary | ICD-10-CM | POA: Diagnosis not present

## 2020-07-01 DIAGNOSIS — J301 Allergic rhinitis due to pollen: Secondary | ICD-10-CM | POA: Diagnosis not present

## 2020-07-01 DIAGNOSIS — J3081 Allergic rhinitis due to animal (cat) (dog) hair and dander: Secondary | ICD-10-CM | POA: Diagnosis not present

## 2020-07-10 ENCOUNTER — Other Ambulatory Visit: Payer: Self-pay | Admitting: Family Medicine

## 2020-07-10 ENCOUNTER — Ambulatory Visit
Admission: RE | Admit: 2020-07-10 | Discharge: 2020-07-10 | Disposition: A | Discharge status: Home / Self Care | Payer: BLUE CROSS/BLUE SHIELD | Source: Ambulatory Visit | Attending: Family Medicine | Admitting: Family Medicine

## 2020-07-10 DIAGNOSIS — M79644 Pain in right finger(s): Secondary | ICD-10-CM | POA: Diagnosis not present

## 2020-07-10 DIAGNOSIS — M7989 Other specified soft tissue disorders: Secondary | ICD-10-CM | POA: Diagnosis not present

## 2020-07-10 DIAGNOSIS — R030 Elevated blood-pressure reading, without diagnosis of hypertension: Secondary | ICD-10-CM | POA: Diagnosis not present

## 2020-07-10 DIAGNOSIS — S62521A Displaced fracture of distal phalanx of right thumb, initial encounter for closed fracture: Secondary | ICD-10-CM | POA: Diagnosis not present

## 2020-07-11 DIAGNOSIS — J3081 Allergic rhinitis due to animal (cat) (dog) hair and dander: Secondary | ICD-10-CM | POA: Diagnosis not present

## 2020-07-11 DIAGNOSIS — J301 Allergic rhinitis due to pollen: Secondary | ICD-10-CM | POA: Diagnosis not present

## 2020-07-11 DIAGNOSIS — J3089 Other allergic rhinitis: Secondary | ICD-10-CM | POA: Diagnosis not present

## 2020-07-16 DIAGNOSIS — M79644 Pain in right finger(s): Secondary | ICD-10-CM | POA: Diagnosis not present

## 2020-07-16 DIAGNOSIS — S62521A Displaced fracture of distal phalanx of right thumb, initial encounter for closed fracture: Secondary | ICD-10-CM | POA: Diagnosis not present

## 2020-07-18 DIAGNOSIS — J301 Allergic rhinitis due to pollen: Secondary | ICD-10-CM | POA: Diagnosis not present

## 2020-07-18 DIAGNOSIS — J3089 Other allergic rhinitis: Secondary | ICD-10-CM | POA: Diagnosis not present

## 2020-07-18 DIAGNOSIS — J3081 Allergic rhinitis due to animal (cat) (dog) hair and dander: Secondary | ICD-10-CM | POA: Diagnosis not present

## 2020-07-22 DIAGNOSIS — J301 Allergic rhinitis due to pollen: Secondary | ICD-10-CM | POA: Diagnosis not present

## 2020-07-22 DIAGNOSIS — J3081 Allergic rhinitis due to animal (cat) (dog) hair and dander: Secondary | ICD-10-CM | POA: Diagnosis not present

## 2020-07-22 DIAGNOSIS — J3089 Other allergic rhinitis: Secondary | ICD-10-CM | POA: Diagnosis not present

## 2020-07-29 DIAGNOSIS — J3081 Allergic rhinitis due to animal (cat) (dog) hair and dander: Secondary | ICD-10-CM | POA: Diagnosis not present

## 2020-07-29 DIAGNOSIS — J301 Allergic rhinitis due to pollen: Secondary | ICD-10-CM | POA: Diagnosis not present

## 2020-07-29 DIAGNOSIS — J3089 Other allergic rhinitis: Secondary | ICD-10-CM | POA: Diagnosis not present

## 2020-08-05 DIAGNOSIS — J3081 Allergic rhinitis due to animal (cat) (dog) hair and dander: Secondary | ICD-10-CM | POA: Diagnosis not present

## 2020-08-05 DIAGNOSIS — J301 Allergic rhinitis due to pollen: Secondary | ICD-10-CM | POA: Diagnosis not present

## 2020-08-05 DIAGNOSIS — J3089 Other allergic rhinitis: Secondary | ICD-10-CM | POA: Diagnosis not present

## 2020-08-08 DIAGNOSIS — J3081 Allergic rhinitis due to animal (cat) (dog) hair and dander: Secondary | ICD-10-CM | POA: Diagnosis not present

## 2020-08-08 DIAGNOSIS — J3089 Other allergic rhinitis: Secondary | ICD-10-CM | POA: Diagnosis not present

## 2020-08-08 DIAGNOSIS — J301 Allergic rhinitis due to pollen: Secondary | ICD-10-CM | POA: Diagnosis not present

## 2020-08-13 DIAGNOSIS — M79644 Pain in right finger(s): Secondary | ICD-10-CM | POA: Diagnosis not present

## 2020-08-13 DIAGNOSIS — S62521A Displaced fracture of distal phalanx of right thumb, initial encounter for closed fracture: Secondary | ICD-10-CM | POA: Diagnosis not present

## 2020-08-13 DIAGNOSIS — S62521D Displaced fracture of distal phalanx of right thumb, subsequent encounter for fracture with routine healing: Secondary | ICD-10-CM | POA: Diagnosis not present

## 2020-08-14 DIAGNOSIS — J3081 Allergic rhinitis due to animal (cat) (dog) hair and dander: Secondary | ICD-10-CM | POA: Diagnosis not present

## 2020-08-14 DIAGNOSIS — J301 Allergic rhinitis due to pollen: Secondary | ICD-10-CM | POA: Diagnosis not present

## 2020-08-14 DIAGNOSIS — J3089 Other allergic rhinitis: Secondary | ICD-10-CM | POA: Diagnosis not present

## 2020-08-28 DIAGNOSIS — J3081 Allergic rhinitis due to animal (cat) (dog) hair and dander: Secondary | ICD-10-CM | POA: Diagnosis not present

## 2020-08-28 DIAGNOSIS — J301 Allergic rhinitis due to pollen: Secondary | ICD-10-CM | POA: Diagnosis not present

## 2020-08-28 DIAGNOSIS — J3089 Other allergic rhinitis: Secondary | ICD-10-CM | POA: Diagnosis not present

## 2020-09-05 DIAGNOSIS — J301 Allergic rhinitis due to pollen: Secondary | ICD-10-CM | POA: Diagnosis not present

## 2020-09-05 DIAGNOSIS — J3089 Other allergic rhinitis: Secondary | ICD-10-CM | POA: Diagnosis not present

## 2020-09-05 DIAGNOSIS — J3081 Allergic rhinitis due to animal (cat) (dog) hair and dander: Secondary | ICD-10-CM | POA: Diagnosis not present

## 2020-09-10 DIAGNOSIS — J3081 Allergic rhinitis due to animal (cat) (dog) hair and dander: Secondary | ICD-10-CM | POA: Diagnosis not present

## 2020-09-10 DIAGNOSIS — J3089 Other allergic rhinitis: Secondary | ICD-10-CM | POA: Diagnosis not present

## 2020-09-10 DIAGNOSIS — J301 Allergic rhinitis due to pollen: Secondary | ICD-10-CM | POA: Diagnosis not present

## 2020-09-23 DIAGNOSIS — J3081 Allergic rhinitis due to animal (cat) (dog) hair and dander: Secondary | ICD-10-CM | POA: Diagnosis not present

## 2020-09-23 DIAGNOSIS — J3089 Other allergic rhinitis: Secondary | ICD-10-CM | POA: Diagnosis not present

## 2020-09-23 DIAGNOSIS — J301 Allergic rhinitis due to pollen: Secondary | ICD-10-CM | POA: Diagnosis not present

## 2020-10-04 DIAGNOSIS — U071 COVID-19: Secondary | ICD-10-CM | POA: Diagnosis not present

## 2020-10-13 DIAGNOSIS — J301 Allergic rhinitis due to pollen: Secondary | ICD-10-CM | POA: Diagnosis not present

## 2020-10-13 DIAGNOSIS — J3089 Other allergic rhinitis: Secondary | ICD-10-CM | POA: Diagnosis not present

## 2020-10-13 DIAGNOSIS — J3081 Allergic rhinitis due to animal (cat) (dog) hair and dander: Secondary | ICD-10-CM | POA: Diagnosis not present

## 2020-10-18 DIAGNOSIS — J301 Allergic rhinitis due to pollen: Secondary | ICD-10-CM | POA: Diagnosis not present

## 2020-10-20 DIAGNOSIS — J3089 Other allergic rhinitis: Secondary | ICD-10-CM | POA: Diagnosis not present

## 2020-10-20 DIAGNOSIS — J3081 Allergic rhinitis due to animal (cat) (dog) hair and dander: Secondary | ICD-10-CM | POA: Diagnosis not present

## 2020-10-22 DIAGNOSIS — J3089 Other allergic rhinitis: Secondary | ICD-10-CM | POA: Diagnosis not present

## 2020-10-22 DIAGNOSIS — J3081 Allergic rhinitis due to animal (cat) (dog) hair and dander: Secondary | ICD-10-CM | POA: Diagnosis not present

## 2020-10-22 DIAGNOSIS — J301 Allergic rhinitis due to pollen: Secondary | ICD-10-CM | POA: Diagnosis not present

## 2020-10-29 DIAGNOSIS — J301 Allergic rhinitis due to pollen: Secondary | ICD-10-CM | POA: Diagnosis not present

## 2020-10-29 DIAGNOSIS — J3081 Allergic rhinitis due to animal (cat) (dog) hair and dander: Secondary | ICD-10-CM | POA: Diagnosis not present

## 2020-10-29 DIAGNOSIS — J3089 Other allergic rhinitis: Secondary | ICD-10-CM | POA: Diagnosis not present

## 2020-11-04 DIAGNOSIS — J301 Allergic rhinitis due to pollen: Secondary | ICD-10-CM | POA: Diagnosis not present

## 2020-11-04 DIAGNOSIS — J3089 Other allergic rhinitis: Secondary | ICD-10-CM | POA: Diagnosis not present

## 2020-11-04 DIAGNOSIS — J3081 Allergic rhinitis due to animal (cat) (dog) hair and dander: Secondary | ICD-10-CM | POA: Diagnosis not present

## 2020-11-19 DIAGNOSIS — J301 Allergic rhinitis due to pollen: Secondary | ICD-10-CM | POA: Diagnosis not present

## 2020-11-19 DIAGNOSIS — J3081 Allergic rhinitis due to animal (cat) (dog) hair and dander: Secondary | ICD-10-CM | POA: Diagnosis not present

## 2020-11-19 DIAGNOSIS — J3089 Other allergic rhinitis: Secondary | ICD-10-CM | POA: Diagnosis not present

## 2020-11-28 DIAGNOSIS — J301 Allergic rhinitis due to pollen: Secondary | ICD-10-CM | POA: Diagnosis not present

## 2020-11-28 DIAGNOSIS — J3089 Other allergic rhinitis: Secondary | ICD-10-CM | POA: Diagnosis not present

## 2020-11-28 DIAGNOSIS — J3081 Allergic rhinitis due to animal (cat) (dog) hair and dander: Secondary | ICD-10-CM | POA: Diagnosis not present

## 2020-12-03 DIAGNOSIS — J3089 Other allergic rhinitis: Secondary | ICD-10-CM | POA: Diagnosis not present

## 2020-12-03 DIAGNOSIS — J301 Allergic rhinitis due to pollen: Secondary | ICD-10-CM | POA: Diagnosis not present

## 2020-12-03 DIAGNOSIS — J3081 Allergic rhinitis due to animal (cat) (dog) hair and dander: Secondary | ICD-10-CM | POA: Diagnosis not present

## 2020-12-09 DIAGNOSIS — J3081 Allergic rhinitis due to animal (cat) (dog) hair and dander: Secondary | ICD-10-CM | POA: Diagnosis not present

## 2020-12-09 DIAGNOSIS — J3089 Other allergic rhinitis: Secondary | ICD-10-CM | POA: Diagnosis not present

## 2020-12-09 DIAGNOSIS — J301 Allergic rhinitis due to pollen: Secondary | ICD-10-CM | POA: Diagnosis not present

## 2020-12-17 DIAGNOSIS — J301 Allergic rhinitis due to pollen: Secondary | ICD-10-CM | POA: Diagnosis not present

## 2020-12-17 DIAGNOSIS — J3081 Allergic rhinitis due to animal (cat) (dog) hair and dander: Secondary | ICD-10-CM | POA: Diagnosis not present

## 2020-12-17 DIAGNOSIS — J3089 Other allergic rhinitis: Secondary | ICD-10-CM | POA: Diagnosis not present

## 2020-12-26 DIAGNOSIS — J301 Allergic rhinitis due to pollen: Secondary | ICD-10-CM | POA: Diagnosis not present

## 2020-12-26 DIAGNOSIS — J3081 Allergic rhinitis due to animal (cat) (dog) hair and dander: Secondary | ICD-10-CM | POA: Diagnosis not present

## 2020-12-26 DIAGNOSIS — J3089 Other allergic rhinitis: Secondary | ICD-10-CM | POA: Diagnosis not present

## 2021-01-05 DIAGNOSIS — J301 Allergic rhinitis due to pollen: Secondary | ICD-10-CM | POA: Diagnosis not present

## 2021-01-05 DIAGNOSIS — J3081 Allergic rhinitis due to animal (cat) (dog) hair and dander: Secondary | ICD-10-CM | POA: Diagnosis not present

## 2021-01-05 DIAGNOSIS — J3089 Other allergic rhinitis: Secondary | ICD-10-CM | POA: Diagnosis not present

## 2021-01-09 DIAGNOSIS — J3081 Allergic rhinitis due to animal (cat) (dog) hair and dander: Secondary | ICD-10-CM | POA: Diagnosis not present

## 2021-01-09 DIAGNOSIS — J301 Allergic rhinitis due to pollen: Secondary | ICD-10-CM | POA: Diagnosis not present

## 2021-01-09 DIAGNOSIS — J3089 Other allergic rhinitis: Secondary | ICD-10-CM | POA: Diagnosis not present

## 2021-01-21 DIAGNOSIS — J301 Allergic rhinitis due to pollen: Secondary | ICD-10-CM | POA: Diagnosis not present

## 2021-01-21 DIAGNOSIS — J3089 Other allergic rhinitis: Secondary | ICD-10-CM | POA: Diagnosis not present

## 2021-01-21 DIAGNOSIS — J3081 Allergic rhinitis due to animal (cat) (dog) hair and dander: Secondary | ICD-10-CM | POA: Diagnosis not present

## 2021-02-02 DIAGNOSIS — J3081 Allergic rhinitis due to animal (cat) (dog) hair and dander: Secondary | ICD-10-CM | POA: Diagnosis not present

## 2021-02-02 DIAGNOSIS — J3089 Other allergic rhinitis: Secondary | ICD-10-CM | POA: Diagnosis not present

## 2021-02-02 DIAGNOSIS — J301 Allergic rhinitis due to pollen: Secondary | ICD-10-CM | POA: Diagnosis not present

## 2021-02-04 DIAGNOSIS — J301 Allergic rhinitis due to pollen: Secondary | ICD-10-CM | POA: Diagnosis not present

## 2021-02-04 DIAGNOSIS — J3089 Other allergic rhinitis: Secondary | ICD-10-CM | POA: Diagnosis not present

## 2021-02-04 DIAGNOSIS — J3081 Allergic rhinitis due to animal (cat) (dog) hair and dander: Secondary | ICD-10-CM | POA: Diagnosis not present

## 2021-02-10 DIAGNOSIS — J301 Allergic rhinitis due to pollen: Secondary | ICD-10-CM | POA: Diagnosis not present

## 2021-02-10 DIAGNOSIS — J3089 Other allergic rhinitis: Secondary | ICD-10-CM | POA: Diagnosis not present

## 2021-02-10 DIAGNOSIS — J3081 Allergic rhinitis due to animal (cat) (dog) hair and dander: Secondary | ICD-10-CM | POA: Diagnosis not present

## 2021-02-13 DIAGNOSIS — J301 Allergic rhinitis due to pollen: Secondary | ICD-10-CM | POA: Diagnosis not present

## 2021-02-13 DIAGNOSIS — J3081 Allergic rhinitis due to animal (cat) (dog) hair and dander: Secondary | ICD-10-CM | POA: Diagnosis not present

## 2021-02-13 DIAGNOSIS — J3089 Other allergic rhinitis: Secondary | ICD-10-CM | POA: Diagnosis not present

## 2021-02-19 DIAGNOSIS — J301 Allergic rhinitis due to pollen: Secondary | ICD-10-CM | POA: Diagnosis not present

## 2021-02-19 DIAGNOSIS — J3081 Allergic rhinitis due to animal (cat) (dog) hair and dander: Secondary | ICD-10-CM | POA: Diagnosis not present

## 2021-02-19 DIAGNOSIS — Z23 Encounter for immunization: Secondary | ICD-10-CM | POA: Diagnosis not present

## 2021-02-19 DIAGNOSIS — J3089 Other allergic rhinitis: Secondary | ICD-10-CM | POA: Diagnosis not present

## 2021-02-25 DIAGNOSIS — J3081 Allergic rhinitis due to animal (cat) (dog) hair and dander: Secondary | ICD-10-CM | POA: Diagnosis not present

## 2021-02-25 DIAGNOSIS — J3089 Other allergic rhinitis: Secondary | ICD-10-CM | POA: Diagnosis not present

## 2021-02-25 DIAGNOSIS — J301 Allergic rhinitis due to pollen: Secondary | ICD-10-CM | POA: Diagnosis not present

## 2021-03-03 DIAGNOSIS — J3089 Other allergic rhinitis: Secondary | ICD-10-CM | POA: Diagnosis not present

## 2021-03-03 DIAGNOSIS — J301 Allergic rhinitis due to pollen: Secondary | ICD-10-CM | POA: Diagnosis not present

## 2021-03-03 DIAGNOSIS — J3081 Allergic rhinitis due to animal (cat) (dog) hair and dander: Secondary | ICD-10-CM | POA: Diagnosis not present

## 2021-03-10 DIAGNOSIS — J3089 Other allergic rhinitis: Secondary | ICD-10-CM | POA: Diagnosis not present

## 2021-03-10 DIAGNOSIS — J301 Allergic rhinitis due to pollen: Secondary | ICD-10-CM | POA: Diagnosis not present

## 2021-03-10 DIAGNOSIS — J3081 Allergic rhinitis due to animal (cat) (dog) hair and dander: Secondary | ICD-10-CM | POA: Diagnosis not present

## 2021-03-16 DIAGNOSIS — J301 Allergic rhinitis due to pollen: Secondary | ICD-10-CM | POA: Diagnosis not present

## 2021-03-16 DIAGNOSIS — J3081 Allergic rhinitis due to animal (cat) (dog) hair and dander: Secondary | ICD-10-CM | POA: Diagnosis not present

## 2021-03-16 DIAGNOSIS — J3089 Other allergic rhinitis: Secondary | ICD-10-CM | POA: Diagnosis not present

## 2021-03-26 DIAGNOSIS — J301 Allergic rhinitis due to pollen: Secondary | ICD-10-CM | POA: Diagnosis not present

## 2021-03-26 DIAGNOSIS — J3089 Other allergic rhinitis: Secondary | ICD-10-CM | POA: Diagnosis not present

## 2021-03-26 DIAGNOSIS — J3081 Allergic rhinitis due to animal (cat) (dog) hair and dander: Secondary | ICD-10-CM | POA: Diagnosis not present

## 2021-03-31 DIAGNOSIS — J3089 Other allergic rhinitis: Secondary | ICD-10-CM | POA: Diagnosis not present

## 2021-03-31 DIAGNOSIS — J3081 Allergic rhinitis due to animal (cat) (dog) hair and dander: Secondary | ICD-10-CM | POA: Diagnosis not present

## 2021-03-31 DIAGNOSIS — J301 Allergic rhinitis due to pollen: Secondary | ICD-10-CM | POA: Diagnosis not present

## 2021-04-09 DIAGNOSIS — J301 Allergic rhinitis due to pollen: Secondary | ICD-10-CM | POA: Diagnosis not present

## 2021-04-09 DIAGNOSIS — J3081 Allergic rhinitis due to animal (cat) (dog) hair and dander: Secondary | ICD-10-CM | POA: Diagnosis not present

## 2021-04-09 DIAGNOSIS — J3089 Other allergic rhinitis: Secondary | ICD-10-CM | POA: Diagnosis not present

## 2021-04-13 DIAGNOSIS — M67871 Other specified disorders of synovium, right ankle and foot: Secondary | ICD-10-CM | POA: Diagnosis not present

## 2021-04-15 DIAGNOSIS — J3081 Allergic rhinitis due to animal (cat) (dog) hair and dander: Secondary | ICD-10-CM | POA: Diagnosis not present

## 2021-04-15 DIAGNOSIS — J301 Allergic rhinitis due to pollen: Secondary | ICD-10-CM | POA: Diagnosis not present

## 2021-04-15 DIAGNOSIS — J3089 Other allergic rhinitis: Secondary | ICD-10-CM | POA: Diagnosis not present

## 2021-04-23 DIAGNOSIS — I10 Essential (primary) hypertension: Secondary | ICD-10-CM | POA: Diagnosis not present

## 2021-04-23 DIAGNOSIS — Z8546 Personal history of malignant neoplasm of prostate: Secondary | ICD-10-CM | POA: Diagnosis not present

## 2021-04-23 DIAGNOSIS — Z0000 Encounter for general adult medical examination without abnormal findings: Secondary | ICD-10-CM | POA: Diagnosis not present

## 2021-04-23 DIAGNOSIS — F419 Anxiety disorder, unspecified: Secondary | ICD-10-CM | POA: Diagnosis not present

## 2021-04-23 DIAGNOSIS — L309 Dermatitis, unspecified: Secondary | ICD-10-CM | POA: Diagnosis not present

## 2021-04-24 DIAGNOSIS — J3081 Allergic rhinitis due to animal (cat) (dog) hair and dander: Secondary | ICD-10-CM | POA: Diagnosis not present

## 2021-04-24 DIAGNOSIS — J3089 Other allergic rhinitis: Secondary | ICD-10-CM | POA: Diagnosis not present

## 2021-04-24 DIAGNOSIS — J301 Allergic rhinitis due to pollen: Secondary | ICD-10-CM | POA: Diagnosis not present

## 2021-04-27 DIAGNOSIS — M79671 Pain in right foot: Secondary | ICD-10-CM | POA: Diagnosis not present

## 2021-05-07 DIAGNOSIS — J3081 Allergic rhinitis due to animal (cat) (dog) hair and dander: Secondary | ICD-10-CM | POA: Diagnosis not present

## 2021-05-07 DIAGNOSIS — J3089 Other allergic rhinitis: Secondary | ICD-10-CM | POA: Diagnosis not present

## 2021-05-07 DIAGNOSIS — J301 Allergic rhinitis due to pollen: Secondary | ICD-10-CM | POA: Diagnosis not present

## 2021-05-22 DIAGNOSIS — J3089 Other allergic rhinitis: Secondary | ICD-10-CM | POA: Diagnosis not present

## 2021-05-22 DIAGNOSIS — J301 Allergic rhinitis due to pollen: Secondary | ICD-10-CM | POA: Diagnosis not present

## 2021-05-22 DIAGNOSIS — J3081 Allergic rhinitis due to animal (cat) (dog) hair and dander: Secondary | ICD-10-CM | POA: Diagnosis not present

## 2021-05-27 DIAGNOSIS — J3081 Allergic rhinitis due to animal (cat) (dog) hair and dander: Secondary | ICD-10-CM | POA: Diagnosis not present

## 2021-05-27 DIAGNOSIS — J301 Allergic rhinitis due to pollen: Secondary | ICD-10-CM | POA: Diagnosis not present

## 2021-05-27 DIAGNOSIS — J3089 Other allergic rhinitis: Secondary | ICD-10-CM | POA: Diagnosis not present

## 2021-06-02 DIAGNOSIS — J301 Allergic rhinitis due to pollen: Secondary | ICD-10-CM | POA: Diagnosis not present

## 2021-06-02 DIAGNOSIS — J3081 Allergic rhinitis due to animal (cat) (dog) hair and dander: Secondary | ICD-10-CM | POA: Diagnosis not present

## 2021-06-02 DIAGNOSIS — J3089 Other allergic rhinitis: Secondary | ICD-10-CM | POA: Diagnosis not present

## 2021-06-08 DIAGNOSIS — I10 Essential (primary) hypertension: Secondary | ICD-10-CM | POA: Diagnosis not present

## 2021-06-08 DIAGNOSIS — J301 Allergic rhinitis due to pollen: Secondary | ICD-10-CM | POA: Diagnosis not present

## 2021-06-09 DIAGNOSIS — J3089 Other allergic rhinitis: Secondary | ICD-10-CM | POA: Diagnosis not present

## 2021-06-09 DIAGNOSIS — J3081 Allergic rhinitis due to animal (cat) (dog) hair and dander: Secondary | ICD-10-CM | POA: Diagnosis not present

## 2021-06-16 DIAGNOSIS — J301 Allergic rhinitis due to pollen: Secondary | ICD-10-CM | POA: Diagnosis not present

## 2021-06-16 DIAGNOSIS — J3089 Other allergic rhinitis: Secondary | ICD-10-CM | POA: Diagnosis not present

## 2021-06-16 DIAGNOSIS — J3081 Allergic rhinitis due to animal (cat) (dog) hair and dander: Secondary | ICD-10-CM | POA: Diagnosis not present

## 2021-06-23 DIAGNOSIS — J301 Allergic rhinitis due to pollen: Secondary | ICD-10-CM | POA: Diagnosis not present

## 2021-06-23 DIAGNOSIS — J3089 Other allergic rhinitis: Secondary | ICD-10-CM | POA: Diagnosis not present

## 2021-06-23 DIAGNOSIS — J3081 Allergic rhinitis due to animal (cat) (dog) hair and dander: Secondary | ICD-10-CM | POA: Diagnosis not present

## 2021-07-07 DIAGNOSIS — J3081 Allergic rhinitis due to animal (cat) (dog) hair and dander: Secondary | ICD-10-CM | POA: Diagnosis not present

## 2021-07-07 DIAGNOSIS — J301 Allergic rhinitis due to pollen: Secondary | ICD-10-CM | POA: Diagnosis not present

## 2021-07-07 DIAGNOSIS — J3089 Other allergic rhinitis: Secondary | ICD-10-CM | POA: Diagnosis not present

## 2021-07-15 DIAGNOSIS — J301 Allergic rhinitis due to pollen: Secondary | ICD-10-CM | POA: Diagnosis not present

## 2021-07-15 DIAGNOSIS — J3089 Other allergic rhinitis: Secondary | ICD-10-CM | POA: Diagnosis not present

## 2021-07-15 DIAGNOSIS — J3081 Allergic rhinitis due to animal (cat) (dog) hair and dander: Secondary | ICD-10-CM | POA: Diagnosis not present

## 2021-07-27 DIAGNOSIS — J301 Allergic rhinitis due to pollen: Secondary | ICD-10-CM | POA: Diagnosis not present

## 2021-07-27 DIAGNOSIS — J3089 Other allergic rhinitis: Secondary | ICD-10-CM | POA: Diagnosis not present

## 2021-07-27 DIAGNOSIS — J3081 Allergic rhinitis due to animal (cat) (dog) hair and dander: Secondary | ICD-10-CM | POA: Diagnosis not present

## 2021-08-04 DIAGNOSIS — Z1211 Encounter for screening for malignant neoplasm of colon: Secondary | ICD-10-CM | POA: Diagnosis not present

## 2021-08-06 DIAGNOSIS — J3089 Other allergic rhinitis: Secondary | ICD-10-CM | POA: Diagnosis not present

## 2021-08-06 DIAGNOSIS — J301 Allergic rhinitis due to pollen: Secondary | ICD-10-CM | POA: Diagnosis not present

## 2021-08-06 DIAGNOSIS — J3081 Allergic rhinitis due to animal (cat) (dog) hair and dander: Secondary | ICD-10-CM | POA: Diagnosis not present

## 2021-08-12 DIAGNOSIS — J301 Allergic rhinitis due to pollen: Secondary | ICD-10-CM | POA: Diagnosis not present

## 2021-08-12 DIAGNOSIS — J3081 Allergic rhinitis due to animal (cat) (dog) hair and dander: Secondary | ICD-10-CM | POA: Diagnosis not present

## 2021-08-12 DIAGNOSIS — J3089 Other allergic rhinitis: Secondary | ICD-10-CM | POA: Diagnosis not present

## 2021-08-21 DIAGNOSIS — J3089 Other allergic rhinitis: Secondary | ICD-10-CM | POA: Diagnosis not present

## 2021-08-21 DIAGNOSIS — J301 Allergic rhinitis due to pollen: Secondary | ICD-10-CM | POA: Diagnosis not present

## 2021-08-21 DIAGNOSIS — J3081 Allergic rhinitis due to animal (cat) (dog) hair and dander: Secondary | ICD-10-CM | POA: Diagnosis not present

## 2021-08-28 DIAGNOSIS — J301 Allergic rhinitis due to pollen: Secondary | ICD-10-CM | POA: Diagnosis not present

## 2021-08-28 DIAGNOSIS — J3081 Allergic rhinitis due to animal (cat) (dog) hair and dander: Secondary | ICD-10-CM | POA: Diagnosis not present

## 2021-08-28 DIAGNOSIS — J3089 Other allergic rhinitis: Secondary | ICD-10-CM | POA: Diagnosis not present

## 2021-09-07 DIAGNOSIS — J3089 Other allergic rhinitis: Secondary | ICD-10-CM | POA: Diagnosis not present

## 2021-09-07 DIAGNOSIS — J301 Allergic rhinitis due to pollen: Secondary | ICD-10-CM | POA: Diagnosis not present

## 2021-09-07 DIAGNOSIS — J3081 Allergic rhinitis due to animal (cat) (dog) hair and dander: Secondary | ICD-10-CM | POA: Diagnosis not present

## 2021-09-09 DIAGNOSIS — J301 Allergic rhinitis due to pollen: Secondary | ICD-10-CM | POA: Diagnosis not present

## 2021-09-09 DIAGNOSIS — J3089 Other allergic rhinitis: Secondary | ICD-10-CM | POA: Diagnosis not present

## 2021-09-09 DIAGNOSIS — J3081 Allergic rhinitis due to animal (cat) (dog) hair and dander: Secondary | ICD-10-CM | POA: Diagnosis not present

## 2021-09-15 DIAGNOSIS — J301 Allergic rhinitis due to pollen: Secondary | ICD-10-CM | POA: Diagnosis not present

## 2021-09-15 DIAGNOSIS — J3089 Other allergic rhinitis: Secondary | ICD-10-CM | POA: Diagnosis not present

## 2021-09-15 DIAGNOSIS — J3081 Allergic rhinitis due to animal (cat) (dog) hair and dander: Secondary | ICD-10-CM | POA: Diagnosis not present

## 2021-09-21 DIAGNOSIS — J3081 Allergic rhinitis due to animal (cat) (dog) hair and dander: Secondary | ICD-10-CM | POA: Diagnosis not present

## 2021-09-21 DIAGNOSIS — J3089 Other allergic rhinitis: Secondary | ICD-10-CM | POA: Diagnosis not present

## 2021-09-21 DIAGNOSIS — J301 Allergic rhinitis due to pollen: Secondary | ICD-10-CM | POA: Diagnosis not present

## 2021-09-29 DIAGNOSIS — J3081 Allergic rhinitis due to animal (cat) (dog) hair and dander: Secondary | ICD-10-CM | POA: Diagnosis not present

## 2021-09-29 DIAGNOSIS — J301 Allergic rhinitis due to pollen: Secondary | ICD-10-CM | POA: Diagnosis not present

## 2021-09-29 DIAGNOSIS — J3089 Other allergic rhinitis: Secondary | ICD-10-CM | POA: Diagnosis not present

## 2021-10-08 DIAGNOSIS — J3089 Other allergic rhinitis: Secondary | ICD-10-CM | POA: Diagnosis not present

## 2021-10-08 DIAGNOSIS — J301 Allergic rhinitis due to pollen: Secondary | ICD-10-CM | POA: Diagnosis not present

## 2021-10-08 DIAGNOSIS — J3081 Allergic rhinitis due to animal (cat) (dog) hair and dander: Secondary | ICD-10-CM | POA: Diagnosis not present

## 2021-10-15 DIAGNOSIS — J3081 Allergic rhinitis due to animal (cat) (dog) hair and dander: Secondary | ICD-10-CM | POA: Diagnosis not present

## 2021-10-15 DIAGNOSIS — J301 Allergic rhinitis due to pollen: Secondary | ICD-10-CM | POA: Diagnosis not present

## 2021-10-15 DIAGNOSIS — J3089 Other allergic rhinitis: Secondary | ICD-10-CM | POA: Diagnosis not present

## 2021-10-26 DIAGNOSIS — J301 Allergic rhinitis due to pollen: Secondary | ICD-10-CM | POA: Diagnosis not present

## 2021-10-26 DIAGNOSIS — J3081 Allergic rhinitis due to animal (cat) (dog) hair and dander: Secondary | ICD-10-CM | POA: Diagnosis not present

## 2021-10-26 DIAGNOSIS — J3089 Other allergic rhinitis: Secondary | ICD-10-CM | POA: Diagnosis not present

## 2021-10-29 IMAGING — CR DG FINGER THUMB 2+V*R*
3 series · 3 of 3 positions shown · non-contrast
Comparison: None.

CLINICAL DATA: Pain, swelling right thumb. Heavy object fell on
hand 1 week ago.

EXAM:
RIGHT THUMB 2+V

[x finger pa right]
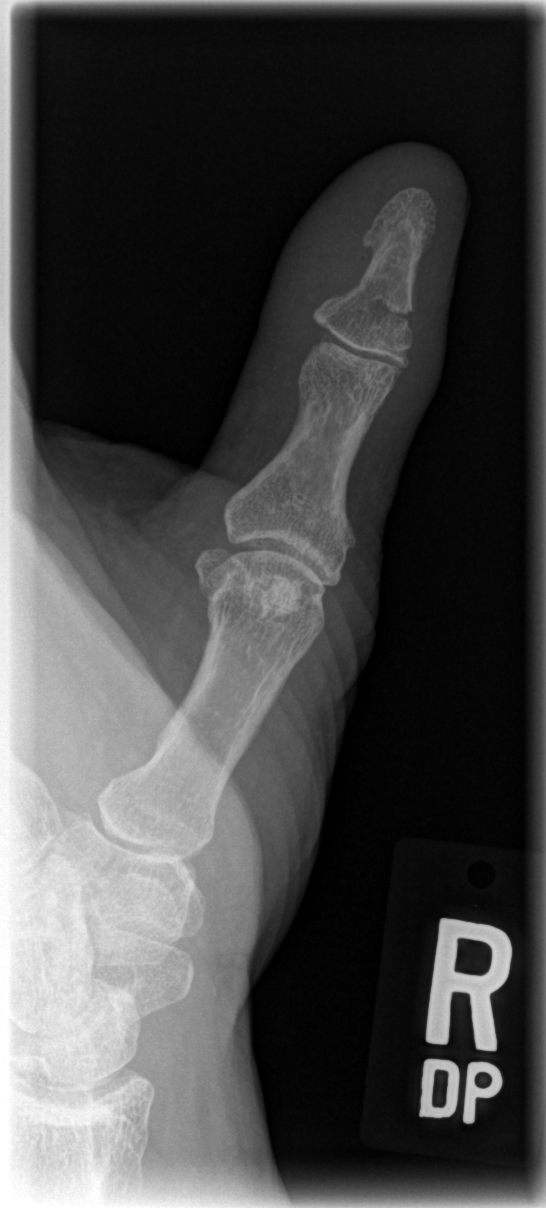

[x finger obl. right]
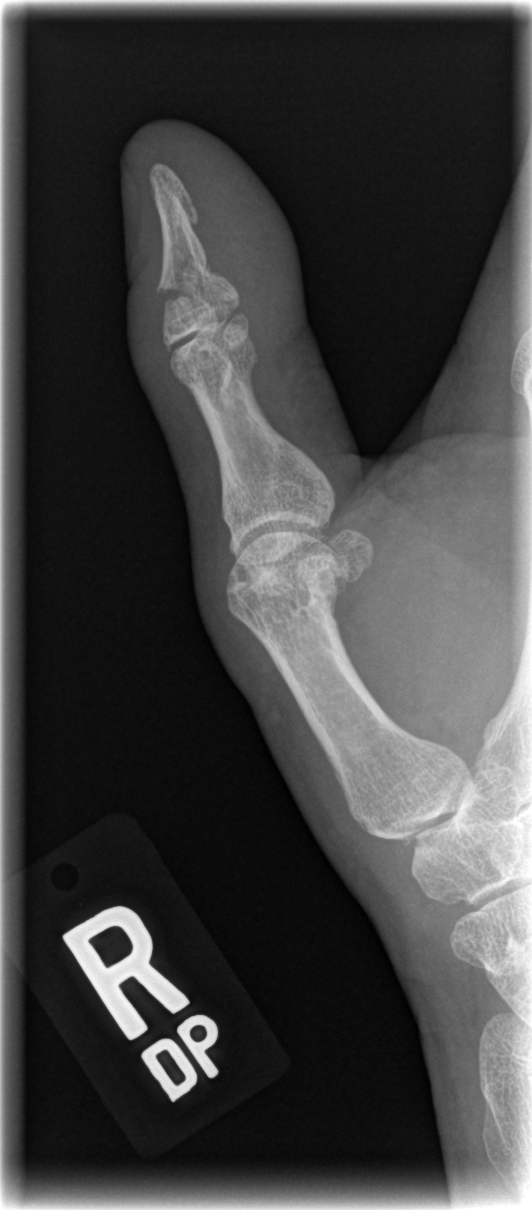

[x finger lateral right]
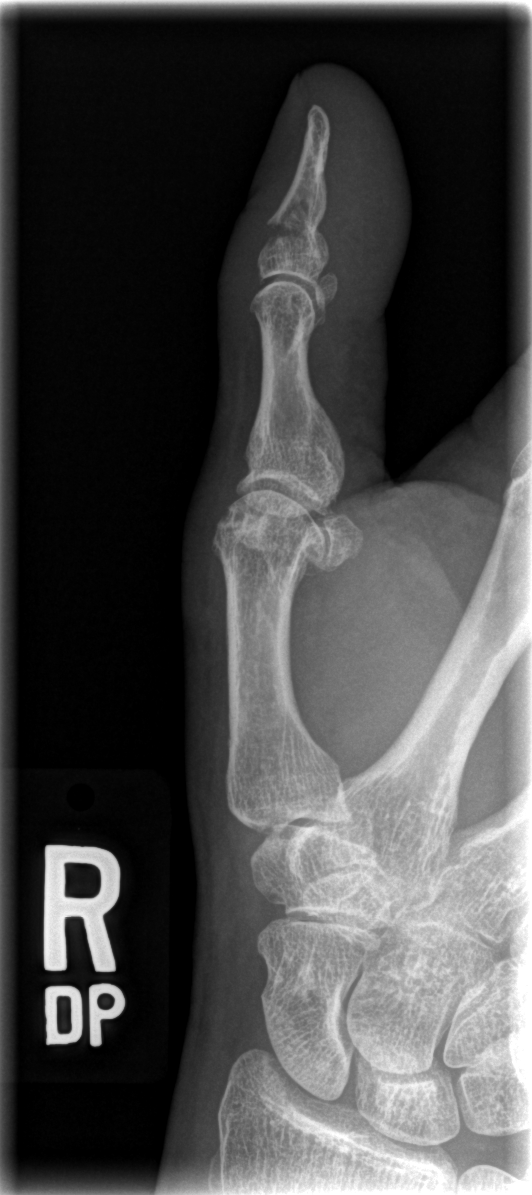

[3 of 3 positions shown; findings below may reference images not displayed]

FINDINGS: There is a minimally displaced fracture through the distal phalanx
of the right thumb. Slight angulation. No subluxation or
dislocation.
IMPRESSION: Slightly angulated and displaced fracture through the distal phalanx
right thumb.

## 2021-10-30 DIAGNOSIS — J301 Allergic rhinitis due to pollen: Secondary | ICD-10-CM | POA: Diagnosis not present

## 2021-10-30 DIAGNOSIS — J3089 Other allergic rhinitis: Secondary | ICD-10-CM | POA: Diagnosis not present

## 2021-10-30 DIAGNOSIS — J3081 Allergic rhinitis due to animal (cat) (dog) hair and dander: Secondary | ICD-10-CM | POA: Diagnosis not present

## 2021-11-11 DIAGNOSIS — J301 Allergic rhinitis due to pollen: Secondary | ICD-10-CM | POA: Diagnosis not present

## 2021-11-11 DIAGNOSIS — J3089 Other allergic rhinitis: Secondary | ICD-10-CM | POA: Diagnosis not present

## 2021-11-11 DIAGNOSIS — J3081 Allergic rhinitis due to animal (cat) (dog) hair and dander: Secondary | ICD-10-CM | POA: Diagnosis not present

## 2021-11-18 DIAGNOSIS — J3089 Other allergic rhinitis: Secondary | ICD-10-CM | POA: Diagnosis not present

## 2021-11-18 DIAGNOSIS — J301 Allergic rhinitis due to pollen: Secondary | ICD-10-CM | POA: Diagnosis not present

## 2021-11-18 DIAGNOSIS — J3081 Allergic rhinitis due to animal (cat) (dog) hair and dander: Secondary | ICD-10-CM | POA: Diagnosis not present

## 2021-11-25 DIAGNOSIS — J3081 Allergic rhinitis due to animal (cat) (dog) hair and dander: Secondary | ICD-10-CM | POA: Diagnosis not present

## 2021-11-25 DIAGNOSIS — J3089 Other allergic rhinitis: Secondary | ICD-10-CM | POA: Diagnosis not present

## 2021-11-25 DIAGNOSIS — J301 Allergic rhinitis due to pollen: Secondary | ICD-10-CM | POA: Diagnosis not present

## 2021-11-27 DIAGNOSIS — I10 Essential (primary) hypertension: Secondary | ICD-10-CM | POA: Diagnosis not present

## 2021-11-27 DIAGNOSIS — F411 Generalized anxiety disorder: Secondary | ICD-10-CM | POA: Diagnosis not present

## 2021-11-27 DIAGNOSIS — R7303 Prediabetes: Secondary | ICD-10-CM | POA: Diagnosis not present

## 2021-12-02 DIAGNOSIS — J301 Allergic rhinitis due to pollen: Secondary | ICD-10-CM | POA: Diagnosis not present

## 2021-12-02 DIAGNOSIS — J3089 Other allergic rhinitis: Secondary | ICD-10-CM | POA: Diagnosis not present

## 2021-12-02 DIAGNOSIS — J3081 Allergic rhinitis due to animal (cat) (dog) hair and dander: Secondary | ICD-10-CM | POA: Diagnosis not present

## 2021-12-15 DIAGNOSIS — J301 Allergic rhinitis due to pollen: Secondary | ICD-10-CM | POA: Diagnosis not present

## 2021-12-15 DIAGNOSIS — J3089 Other allergic rhinitis: Secondary | ICD-10-CM | POA: Diagnosis not present

## 2021-12-15 DIAGNOSIS — J3081 Allergic rhinitis due to animal (cat) (dog) hair and dander: Secondary | ICD-10-CM | POA: Diagnosis not present

## 2021-12-22 DIAGNOSIS — J3089 Other allergic rhinitis: Secondary | ICD-10-CM | POA: Diagnosis not present

## 2021-12-22 DIAGNOSIS — J301 Allergic rhinitis due to pollen: Secondary | ICD-10-CM | POA: Diagnosis not present

## 2021-12-22 DIAGNOSIS — J3081 Allergic rhinitis due to animal (cat) (dog) hair and dander: Secondary | ICD-10-CM | POA: Diagnosis not present

## 2022-01-15 DIAGNOSIS — J301 Allergic rhinitis due to pollen: Secondary | ICD-10-CM | POA: Diagnosis not present

## 2022-01-15 DIAGNOSIS — J3081 Allergic rhinitis due to animal (cat) (dog) hair and dander: Secondary | ICD-10-CM | POA: Diagnosis not present

## 2022-01-15 DIAGNOSIS — J3089 Other allergic rhinitis: Secondary | ICD-10-CM | POA: Diagnosis not present

## 2022-01-22 DIAGNOSIS — J3089 Other allergic rhinitis: Secondary | ICD-10-CM | POA: Diagnosis not present

## 2022-01-22 DIAGNOSIS — J301 Allergic rhinitis due to pollen: Secondary | ICD-10-CM | POA: Diagnosis not present

## 2022-01-22 DIAGNOSIS — J3081 Allergic rhinitis due to animal (cat) (dog) hair and dander: Secondary | ICD-10-CM | POA: Diagnosis not present

## 2022-01-26 DIAGNOSIS — J301 Allergic rhinitis due to pollen: Secondary | ICD-10-CM | POA: Diagnosis not present

## 2022-01-27 DIAGNOSIS — J3081 Allergic rhinitis due to animal (cat) (dog) hair and dander: Secondary | ICD-10-CM | POA: Diagnosis not present

## 2022-01-27 DIAGNOSIS — J3089 Other allergic rhinitis: Secondary | ICD-10-CM | POA: Diagnosis not present

## 2022-02-03 DIAGNOSIS — J3089 Other allergic rhinitis: Secondary | ICD-10-CM | POA: Diagnosis not present

## 2022-02-03 DIAGNOSIS — J3081 Allergic rhinitis due to animal (cat) (dog) hair and dander: Secondary | ICD-10-CM | POA: Diagnosis not present

## 2022-02-03 DIAGNOSIS — J301 Allergic rhinitis due to pollen: Secondary | ICD-10-CM | POA: Diagnosis not present

## 2022-02-09 DIAGNOSIS — J301 Allergic rhinitis due to pollen: Secondary | ICD-10-CM | POA: Diagnosis not present

## 2022-02-09 DIAGNOSIS — J3089 Other allergic rhinitis: Secondary | ICD-10-CM | POA: Diagnosis not present

## 2022-02-09 DIAGNOSIS — J3081 Allergic rhinitis due to animal (cat) (dog) hair and dander: Secondary | ICD-10-CM | POA: Diagnosis not present

## 2022-02-23 DIAGNOSIS — Z23 Encounter for immunization: Secondary | ICD-10-CM | POA: Diagnosis not present

## 2022-02-23 DIAGNOSIS — J301 Allergic rhinitis due to pollen: Secondary | ICD-10-CM | POA: Diagnosis not present

## 2022-02-23 DIAGNOSIS — J3089 Other allergic rhinitis: Secondary | ICD-10-CM | POA: Diagnosis not present

## 2022-02-23 DIAGNOSIS — J3081 Allergic rhinitis due to animal (cat) (dog) hair and dander: Secondary | ICD-10-CM | POA: Diagnosis not present

## 2022-03-03 DIAGNOSIS — J3081 Allergic rhinitis due to animal (cat) (dog) hair and dander: Secondary | ICD-10-CM | POA: Diagnosis not present

## 2022-03-03 DIAGNOSIS — J3089 Other allergic rhinitis: Secondary | ICD-10-CM | POA: Diagnosis not present

## 2022-03-03 DIAGNOSIS — J301 Allergic rhinitis due to pollen: Secondary | ICD-10-CM | POA: Diagnosis not present

## 2022-03-12 DIAGNOSIS — J3081 Allergic rhinitis due to animal (cat) (dog) hair and dander: Secondary | ICD-10-CM | POA: Diagnosis not present

## 2022-03-12 DIAGNOSIS — J301 Allergic rhinitis due to pollen: Secondary | ICD-10-CM | POA: Diagnosis not present

## 2022-03-12 DIAGNOSIS — J3089 Other allergic rhinitis: Secondary | ICD-10-CM | POA: Diagnosis not present

## 2022-03-18 DIAGNOSIS — J3081 Allergic rhinitis due to animal (cat) (dog) hair and dander: Secondary | ICD-10-CM | POA: Diagnosis not present

## 2022-03-18 DIAGNOSIS — J301 Allergic rhinitis due to pollen: Secondary | ICD-10-CM | POA: Diagnosis not present

## 2022-03-18 DIAGNOSIS — J3089 Other allergic rhinitis: Secondary | ICD-10-CM | POA: Diagnosis not present

## 2022-03-29 DIAGNOSIS — J301 Allergic rhinitis due to pollen: Secondary | ICD-10-CM | POA: Diagnosis not present

## 2022-03-29 DIAGNOSIS — J3081 Allergic rhinitis due to animal (cat) (dog) hair and dander: Secondary | ICD-10-CM | POA: Diagnosis not present

## 2022-03-29 DIAGNOSIS — J3089 Other allergic rhinitis: Secondary | ICD-10-CM | POA: Diagnosis not present

## 2022-04-05 DIAGNOSIS — J3089 Other allergic rhinitis: Secondary | ICD-10-CM | POA: Diagnosis not present

## 2022-04-05 DIAGNOSIS — J301 Allergic rhinitis due to pollen: Secondary | ICD-10-CM | POA: Diagnosis not present

## 2022-04-05 DIAGNOSIS — J3081 Allergic rhinitis due to animal (cat) (dog) hair and dander: Secondary | ICD-10-CM | POA: Diagnosis not present

## 2022-04-20 DIAGNOSIS — J3089 Other allergic rhinitis: Secondary | ICD-10-CM | POA: Diagnosis not present

## 2022-04-20 DIAGNOSIS — J3081 Allergic rhinitis due to animal (cat) (dog) hair and dander: Secondary | ICD-10-CM | POA: Diagnosis not present

## 2022-04-20 DIAGNOSIS — J301 Allergic rhinitis due to pollen: Secondary | ICD-10-CM | POA: Diagnosis not present

## 2022-04-26 DIAGNOSIS — L821 Other seborrheic keratosis: Secondary | ICD-10-CM | POA: Diagnosis not present

## 2022-04-26 DIAGNOSIS — D225 Melanocytic nevi of trunk: Secondary | ICD-10-CM | POA: Diagnosis not present

## 2022-04-26 DIAGNOSIS — C4441 Basal cell carcinoma of skin of scalp and neck: Secondary | ICD-10-CM | POA: Diagnosis not present

## 2022-04-26 DIAGNOSIS — D485 Neoplasm of uncertain behavior of skin: Secondary | ICD-10-CM | POA: Diagnosis not present

## 2022-05-11 DIAGNOSIS — J3089 Other allergic rhinitis: Secondary | ICD-10-CM | POA: Diagnosis not present

## 2022-05-11 DIAGNOSIS — J301 Allergic rhinitis due to pollen: Secondary | ICD-10-CM | POA: Diagnosis not present

## 2022-05-11 DIAGNOSIS — J3081 Allergic rhinitis due to animal (cat) (dog) hair and dander: Secondary | ICD-10-CM | POA: Diagnosis not present

## 2022-05-12 DIAGNOSIS — D225 Melanocytic nevi of trunk: Secondary | ICD-10-CM | POA: Diagnosis not present

## 2022-05-12 DIAGNOSIS — D485 Neoplasm of uncertain behavior of skin: Secondary | ICD-10-CM | POA: Diagnosis not present

## 2022-05-17 DIAGNOSIS — J301 Allergic rhinitis due to pollen: Secondary | ICD-10-CM | POA: Diagnosis not present

## 2022-05-17 DIAGNOSIS — J3081 Allergic rhinitis due to animal (cat) (dog) hair and dander: Secondary | ICD-10-CM | POA: Diagnosis not present

## 2022-05-17 DIAGNOSIS — J3089 Other allergic rhinitis: Secondary | ICD-10-CM | POA: Diagnosis not present

## 2022-05-26 DIAGNOSIS — C4441 Basal cell carcinoma of skin of scalp and neck: Secondary | ICD-10-CM | POA: Diagnosis not present

## 2022-05-27 DIAGNOSIS — J3081 Allergic rhinitis due to animal (cat) (dog) hair and dander: Secondary | ICD-10-CM | POA: Diagnosis not present

## 2022-05-27 DIAGNOSIS — J3089 Other allergic rhinitis: Secondary | ICD-10-CM | POA: Diagnosis not present

## 2022-05-27 DIAGNOSIS — J301 Allergic rhinitis due to pollen: Secondary | ICD-10-CM | POA: Diagnosis not present

## 2022-05-31 DIAGNOSIS — N528 Other male erectile dysfunction: Secondary | ICD-10-CM | POA: Diagnosis not present

## 2022-05-31 DIAGNOSIS — Z23 Encounter for immunization: Secondary | ICD-10-CM | POA: Diagnosis not present

## 2022-05-31 DIAGNOSIS — I10 Essential (primary) hypertension: Secondary | ICD-10-CM | POA: Diagnosis not present

## 2022-05-31 DIAGNOSIS — Z0000 Encounter for general adult medical examination without abnormal findings: Secondary | ICD-10-CM | POA: Diagnosis not present

## 2022-05-31 DIAGNOSIS — F411 Generalized anxiety disorder: Secondary | ICD-10-CM | POA: Diagnosis not present

## 2022-05-31 DIAGNOSIS — R7303 Prediabetes: Secondary | ICD-10-CM | POA: Diagnosis not present

## 2022-06-02 DIAGNOSIS — J3081 Allergic rhinitis due to animal (cat) (dog) hair and dander: Secondary | ICD-10-CM | POA: Diagnosis not present

## 2022-06-02 DIAGNOSIS — J301 Allergic rhinitis due to pollen: Secondary | ICD-10-CM | POA: Diagnosis not present

## 2022-06-02 DIAGNOSIS — J3089 Other allergic rhinitis: Secondary | ICD-10-CM | POA: Diagnosis not present

## 2022-06-07 DIAGNOSIS — J3081 Allergic rhinitis due to animal (cat) (dog) hair and dander: Secondary | ICD-10-CM | POA: Diagnosis not present

## 2022-06-07 DIAGNOSIS — J3089 Other allergic rhinitis: Secondary | ICD-10-CM | POA: Diagnosis not present

## 2022-06-07 DIAGNOSIS — J301 Allergic rhinitis due to pollen: Secondary | ICD-10-CM | POA: Diagnosis not present

## 2022-06-18 DIAGNOSIS — J301 Allergic rhinitis due to pollen: Secondary | ICD-10-CM | POA: Diagnosis not present

## 2022-06-18 DIAGNOSIS — J3089 Other allergic rhinitis: Secondary | ICD-10-CM | POA: Diagnosis not present

## 2022-06-18 DIAGNOSIS — J3081 Allergic rhinitis due to animal (cat) (dog) hair and dander: Secondary | ICD-10-CM | POA: Diagnosis not present

## 2022-06-23 DIAGNOSIS — J3081 Allergic rhinitis due to animal (cat) (dog) hair and dander: Secondary | ICD-10-CM | POA: Diagnosis not present

## 2022-06-23 DIAGNOSIS — J3089 Other allergic rhinitis: Secondary | ICD-10-CM | POA: Diagnosis not present

## 2022-06-23 DIAGNOSIS — J301 Allergic rhinitis due to pollen: Secondary | ICD-10-CM | POA: Diagnosis not present

## 2022-06-30 DIAGNOSIS — J3089 Other allergic rhinitis: Secondary | ICD-10-CM | POA: Diagnosis not present

## 2022-06-30 DIAGNOSIS — J301 Allergic rhinitis due to pollen: Secondary | ICD-10-CM | POA: Diagnosis not present

## 2022-06-30 DIAGNOSIS — J3081 Allergic rhinitis due to animal (cat) (dog) hair and dander: Secondary | ICD-10-CM | POA: Diagnosis not present

## 2022-07-05 DIAGNOSIS — M25561 Pain in right knee: Secondary | ICD-10-CM | POA: Diagnosis not present

## 2022-07-06 DIAGNOSIS — H903 Sensorineural hearing loss, bilateral: Secondary | ICD-10-CM | POA: Diagnosis not present

## 2022-07-06 DIAGNOSIS — H9113 Presbycusis, bilateral: Secondary | ICD-10-CM | POA: Diagnosis not present

## 2022-07-06 DIAGNOSIS — H9313 Tinnitus, bilateral: Secondary | ICD-10-CM | POA: Diagnosis not present

## 2022-07-08 DIAGNOSIS — M1812 Unilateral primary osteoarthritis of first carpometacarpal joint, left hand: Secondary | ICD-10-CM | POA: Diagnosis not present

## 2022-07-09 DIAGNOSIS — J3089 Other allergic rhinitis: Secondary | ICD-10-CM | POA: Diagnosis not present

## 2022-07-09 DIAGNOSIS — J301 Allergic rhinitis due to pollen: Secondary | ICD-10-CM | POA: Diagnosis not present

## 2022-07-09 DIAGNOSIS — J3081 Allergic rhinitis due to animal (cat) (dog) hair and dander: Secondary | ICD-10-CM | POA: Diagnosis not present

## 2022-07-19 DIAGNOSIS — J301 Allergic rhinitis due to pollen: Secondary | ICD-10-CM | POA: Diagnosis not present

## 2022-07-19 DIAGNOSIS — J3089 Other allergic rhinitis: Secondary | ICD-10-CM | POA: Diagnosis not present

## 2022-07-19 DIAGNOSIS — J3081 Allergic rhinitis due to animal (cat) (dog) hair and dander: Secondary | ICD-10-CM | POA: Diagnosis not present

## 2022-07-28 DIAGNOSIS — J3081 Allergic rhinitis due to animal (cat) (dog) hair and dander: Secondary | ICD-10-CM | POA: Diagnosis not present

## 2022-07-28 DIAGNOSIS — J3089 Other allergic rhinitis: Secondary | ICD-10-CM | POA: Diagnosis not present

## 2022-07-28 DIAGNOSIS — J301 Allergic rhinitis due to pollen: Secondary | ICD-10-CM | POA: Diagnosis not present

## 2022-07-30 DIAGNOSIS — Z23 Encounter for immunization: Secondary | ICD-10-CM | POA: Diagnosis not present

## 2022-08-18 DIAGNOSIS — J3089 Other allergic rhinitis: Secondary | ICD-10-CM | POA: Diagnosis not present

## 2022-08-18 DIAGNOSIS — J301 Allergic rhinitis due to pollen: Secondary | ICD-10-CM | POA: Diagnosis not present

## 2022-08-18 DIAGNOSIS — J3081 Allergic rhinitis due to animal (cat) (dog) hair and dander: Secondary | ICD-10-CM | POA: Diagnosis not present

## 2022-08-30 DIAGNOSIS — J301 Allergic rhinitis due to pollen: Secondary | ICD-10-CM | POA: Diagnosis not present

## 2022-08-30 DIAGNOSIS — J3081 Allergic rhinitis due to animal (cat) (dog) hair and dander: Secondary | ICD-10-CM | POA: Diagnosis not present

## 2022-08-30 DIAGNOSIS — J3089 Other allergic rhinitis: Secondary | ICD-10-CM | POA: Diagnosis not present

## 2022-09-07 DIAGNOSIS — J3089 Other allergic rhinitis: Secondary | ICD-10-CM | POA: Diagnosis not present

## 2022-09-07 DIAGNOSIS — J3081 Allergic rhinitis due to animal (cat) (dog) hair and dander: Secondary | ICD-10-CM | POA: Diagnosis not present

## 2022-09-07 DIAGNOSIS — J301 Allergic rhinitis due to pollen: Secondary | ICD-10-CM | POA: Diagnosis not present

## 2022-09-14 DIAGNOSIS — J3081 Allergic rhinitis due to animal (cat) (dog) hair and dander: Secondary | ICD-10-CM | POA: Diagnosis not present

## 2022-09-14 DIAGNOSIS — J3089 Other allergic rhinitis: Secondary | ICD-10-CM | POA: Diagnosis not present

## 2022-09-14 DIAGNOSIS — J301 Allergic rhinitis due to pollen: Secondary | ICD-10-CM | POA: Diagnosis not present

## 2022-09-22 DIAGNOSIS — J3089 Other allergic rhinitis: Secondary | ICD-10-CM | POA: Diagnosis not present

## 2022-09-22 DIAGNOSIS — J301 Allergic rhinitis due to pollen: Secondary | ICD-10-CM | POA: Diagnosis not present

## 2022-09-22 DIAGNOSIS — J3081 Allergic rhinitis due to animal (cat) (dog) hair and dander: Secondary | ICD-10-CM | POA: Diagnosis not present

## 2022-09-28 DIAGNOSIS — J3081 Allergic rhinitis due to animal (cat) (dog) hair and dander: Secondary | ICD-10-CM | POA: Diagnosis not present

## 2022-09-28 DIAGNOSIS — J301 Allergic rhinitis due to pollen: Secondary | ICD-10-CM | POA: Diagnosis not present

## 2022-09-28 DIAGNOSIS — J3089 Other allergic rhinitis: Secondary | ICD-10-CM | POA: Diagnosis not present

## 2022-10-07 DIAGNOSIS — H4311 Vitreous hemorrhage, right eye: Secondary | ICD-10-CM | POA: Diagnosis not present

## 2022-10-07 DIAGNOSIS — H33311 Horseshoe tear of retina without detachment, right eye: Secondary | ICD-10-CM | POA: Diagnosis not present

## 2022-10-12 DIAGNOSIS — H33311 Horseshoe tear of retina without detachment, right eye: Secondary | ICD-10-CM | POA: Diagnosis not present

## 2022-10-12 DIAGNOSIS — H4311 Vitreous hemorrhage, right eye: Secondary | ICD-10-CM | POA: Diagnosis not present

## 2022-10-12 DIAGNOSIS — H53141 Visual discomfort, right eye: Secondary | ICD-10-CM | POA: Diagnosis not present

## 2022-10-12 DIAGNOSIS — H43391 Other vitreous opacities, right eye: Secondary | ICD-10-CM | POA: Diagnosis not present

## 2022-10-12 DIAGNOSIS — H33011 Retinal detachment with single break, right eye: Secondary | ICD-10-CM | POA: Diagnosis not present

## 2022-10-12 DIAGNOSIS — H53131 Sudden visual loss, right eye: Secondary | ICD-10-CM | POA: Diagnosis not present

## 2022-10-25 DIAGNOSIS — J3081 Allergic rhinitis due to animal (cat) (dog) hair and dander: Secondary | ICD-10-CM | POA: Diagnosis not present

## 2022-10-25 DIAGNOSIS — J3089 Other allergic rhinitis: Secondary | ICD-10-CM | POA: Diagnosis not present

## 2022-10-25 DIAGNOSIS — J301 Allergic rhinitis due to pollen: Secondary | ICD-10-CM | POA: Diagnosis not present

## 2022-11-02 DIAGNOSIS — J3081 Allergic rhinitis due to animal (cat) (dog) hair and dander: Secondary | ICD-10-CM | POA: Diagnosis not present

## 2022-11-02 DIAGNOSIS — J301 Allergic rhinitis due to pollen: Secondary | ICD-10-CM | POA: Diagnosis not present

## 2022-11-02 DIAGNOSIS — J3089 Other allergic rhinitis: Secondary | ICD-10-CM | POA: Diagnosis not present

## 2022-11-09 DIAGNOSIS — J3089 Other allergic rhinitis: Secondary | ICD-10-CM | POA: Diagnosis not present

## 2022-11-09 DIAGNOSIS — J301 Allergic rhinitis due to pollen: Secondary | ICD-10-CM | POA: Diagnosis not present

## 2022-11-09 DIAGNOSIS — J3081 Allergic rhinitis due to animal (cat) (dog) hair and dander: Secondary | ICD-10-CM | POA: Diagnosis not present

## 2022-11-16 DIAGNOSIS — J301 Allergic rhinitis due to pollen: Secondary | ICD-10-CM | POA: Diagnosis not present

## 2022-11-17 DIAGNOSIS — J3089 Other allergic rhinitis: Secondary | ICD-10-CM | POA: Diagnosis not present

## 2022-11-17 DIAGNOSIS — J3081 Allergic rhinitis due to animal (cat) (dog) hair and dander: Secondary | ICD-10-CM | POA: Diagnosis not present

## 2022-11-22 DIAGNOSIS — J3089 Other allergic rhinitis: Secondary | ICD-10-CM | POA: Diagnosis not present

## 2022-11-22 DIAGNOSIS — J3081 Allergic rhinitis due to animal (cat) (dog) hair and dander: Secondary | ICD-10-CM | POA: Diagnosis not present

## 2022-11-22 DIAGNOSIS — J301 Allergic rhinitis due to pollen: Secondary | ICD-10-CM | POA: Diagnosis not present

## 2022-12-01 DIAGNOSIS — J3081 Allergic rhinitis due to animal (cat) (dog) hair and dander: Secondary | ICD-10-CM | POA: Diagnosis not present

## 2022-12-01 DIAGNOSIS — J3089 Other allergic rhinitis: Secondary | ICD-10-CM | POA: Diagnosis not present

## 2022-12-01 DIAGNOSIS — J301 Allergic rhinitis due to pollen: Secondary | ICD-10-CM | POA: Diagnosis not present

## 2022-12-13 DIAGNOSIS — J301 Allergic rhinitis due to pollen: Secondary | ICD-10-CM | POA: Diagnosis not present

## 2022-12-13 DIAGNOSIS — J3089 Other allergic rhinitis: Secondary | ICD-10-CM | POA: Diagnosis not present

## 2022-12-13 DIAGNOSIS — J3081 Allergic rhinitis due to animal (cat) (dog) hair and dander: Secondary | ICD-10-CM | POA: Diagnosis not present

## 2022-12-20 DIAGNOSIS — J3089 Other allergic rhinitis: Secondary | ICD-10-CM | POA: Diagnosis not present

## 2022-12-20 DIAGNOSIS — J3081 Allergic rhinitis due to animal (cat) (dog) hair and dander: Secondary | ICD-10-CM | POA: Diagnosis not present

## 2022-12-20 DIAGNOSIS — J301 Allergic rhinitis due to pollen: Secondary | ICD-10-CM | POA: Diagnosis not present

## 2023-01-13 DIAGNOSIS — J301 Allergic rhinitis due to pollen: Secondary | ICD-10-CM | POA: Diagnosis not present

## 2023-01-13 DIAGNOSIS — J3089 Other allergic rhinitis: Secondary | ICD-10-CM | POA: Diagnosis not present

## 2023-01-13 DIAGNOSIS — J3081 Allergic rhinitis due to animal (cat) (dog) hair and dander: Secondary | ICD-10-CM | POA: Diagnosis not present

## 2023-01-19 DIAGNOSIS — J301 Allergic rhinitis due to pollen: Secondary | ICD-10-CM | POA: Diagnosis not present

## 2023-01-19 DIAGNOSIS — J3089 Other allergic rhinitis: Secondary | ICD-10-CM | POA: Diagnosis not present

## 2023-01-19 DIAGNOSIS — J3081 Allergic rhinitis due to animal (cat) (dog) hair and dander: Secondary | ICD-10-CM | POA: Diagnosis not present

## 2023-01-28 DIAGNOSIS — J3081 Allergic rhinitis due to animal (cat) (dog) hair and dander: Secondary | ICD-10-CM | POA: Diagnosis not present

## 2023-01-28 DIAGNOSIS — J3089 Other allergic rhinitis: Secondary | ICD-10-CM | POA: Diagnosis not present

## 2023-01-28 DIAGNOSIS — J301 Allergic rhinitis due to pollen: Secondary | ICD-10-CM | POA: Diagnosis not present

## 2023-02-02 DIAGNOSIS — J301 Allergic rhinitis due to pollen: Secondary | ICD-10-CM | POA: Diagnosis not present

## 2023-02-02 DIAGNOSIS — J3081 Allergic rhinitis due to animal (cat) (dog) hair and dander: Secondary | ICD-10-CM | POA: Diagnosis not present

## 2023-02-02 DIAGNOSIS — J3089 Other allergic rhinitis: Secondary | ICD-10-CM | POA: Diagnosis not present

## 2023-02-09 DIAGNOSIS — H35371 Puckering of macula, right eye: Secondary | ICD-10-CM | POA: Diagnosis not present

## 2023-02-09 DIAGNOSIS — H33011 Retinal detachment with single break, right eye: Secondary | ICD-10-CM | POA: Diagnosis not present

## 2023-02-09 DIAGNOSIS — H59811 Chorioretinal scars after surgery for detachment, right eye: Secondary | ICD-10-CM | POA: Diagnosis not present

## 2023-02-09 DIAGNOSIS — H25813 Combined forms of age-related cataract, bilateral: Secondary | ICD-10-CM | POA: Diagnosis not present

## 2023-02-15 DIAGNOSIS — J3081 Allergic rhinitis due to animal (cat) (dog) hair and dander: Secondary | ICD-10-CM | POA: Diagnosis not present

## 2023-02-15 DIAGNOSIS — J3089 Other allergic rhinitis: Secondary | ICD-10-CM | POA: Diagnosis not present

## 2023-02-15 DIAGNOSIS — J301 Allergic rhinitis due to pollen: Secondary | ICD-10-CM | POA: Diagnosis not present

## 2023-02-22 DIAGNOSIS — J3089 Other allergic rhinitis: Secondary | ICD-10-CM | POA: Diagnosis not present

## 2023-02-22 DIAGNOSIS — J301 Allergic rhinitis due to pollen: Secondary | ICD-10-CM | POA: Diagnosis not present

## 2023-02-22 DIAGNOSIS — J3081 Allergic rhinitis due to animal (cat) (dog) hair and dander: Secondary | ICD-10-CM | POA: Diagnosis not present

## 2023-03-01 DIAGNOSIS — J301 Allergic rhinitis due to pollen: Secondary | ICD-10-CM | POA: Diagnosis not present

## 2023-03-01 DIAGNOSIS — J3089 Other allergic rhinitis: Secondary | ICD-10-CM | POA: Diagnosis not present

## 2023-03-01 DIAGNOSIS — J3081 Allergic rhinitis due to animal (cat) (dog) hair and dander: Secondary | ICD-10-CM | POA: Diagnosis not present

## 2023-03-07 DIAGNOSIS — J3081 Allergic rhinitis due to animal (cat) (dog) hair and dander: Secondary | ICD-10-CM | POA: Diagnosis not present

## 2023-03-07 DIAGNOSIS — J301 Allergic rhinitis due to pollen: Secondary | ICD-10-CM | POA: Diagnosis not present

## 2023-03-07 DIAGNOSIS — J3089 Other allergic rhinitis: Secondary | ICD-10-CM | POA: Diagnosis not present

## 2023-03-22 DIAGNOSIS — J301 Allergic rhinitis due to pollen: Secondary | ICD-10-CM | POA: Diagnosis not present

## 2023-03-22 DIAGNOSIS — J3089 Other allergic rhinitis: Secondary | ICD-10-CM | POA: Diagnosis not present

## 2023-03-22 DIAGNOSIS — J3081 Allergic rhinitis due to animal (cat) (dog) hair and dander: Secondary | ICD-10-CM | POA: Diagnosis not present

## 2023-04-04 DIAGNOSIS — J3089 Other allergic rhinitis: Secondary | ICD-10-CM | POA: Diagnosis not present

## 2023-04-04 DIAGNOSIS — J301 Allergic rhinitis due to pollen: Secondary | ICD-10-CM | POA: Diagnosis not present

## 2023-04-04 DIAGNOSIS — J3081 Allergic rhinitis due to animal (cat) (dog) hair and dander: Secondary | ICD-10-CM | POA: Diagnosis not present

## 2023-04-05 DIAGNOSIS — D485 Neoplasm of uncertain behavior of skin: Secondary | ICD-10-CM | POA: Diagnosis not present

## 2023-04-05 DIAGNOSIS — D0421 Carcinoma in situ of skin of right ear and external auricular canal: Secondary | ICD-10-CM | POA: Diagnosis not present

## 2023-04-05 DIAGNOSIS — L820 Inflamed seborrheic keratosis: Secondary | ICD-10-CM | POA: Diagnosis not present

## 2023-04-12 DIAGNOSIS — J301 Allergic rhinitis due to pollen: Secondary | ICD-10-CM | POA: Diagnosis not present

## 2023-04-12 DIAGNOSIS — J3081 Allergic rhinitis due to animal (cat) (dog) hair and dander: Secondary | ICD-10-CM | POA: Diagnosis not present

## 2023-04-12 DIAGNOSIS — J3089 Other allergic rhinitis: Secondary | ICD-10-CM | POA: Diagnosis not present

## 2023-04-19 DIAGNOSIS — J3081 Allergic rhinitis due to animal (cat) (dog) hair and dander: Secondary | ICD-10-CM | POA: Diagnosis not present

## 2023-04-19 DIAGNOSIS — J3089 Other allergic rhinitis: Secondary | ICD-10-CM | POA: Diagnosis not present

## 2023-04-19 DIAGNOSIS — J301 Allergic rhinitis due to pollen: Secondary | ICD-10-CM | POA: Diagnosis not present

## 2023-04-25 DIAGNOSIS — J301 Allergic rhinitis due to pollen: Secondary | ICD-10-CM | POA: Diagnosis not present

## 2023-04-25 DIAGNOSIS — J3081 Allergic rhinitis due to animal (cat) (dog) hair and dander: Secondary | ICD-10-CM | POA: Diagnosis not present

## 2023-04-25 DIAGNOSIS — J3089 Other allergic rhinitis: Secondary | ICD-10-CM | POA: Diagnosis not present

## 2023-05-09 DIAGNOSIS — J301 Allergic rhinitis due to pollen: Secondary | ICD-10-CM | POA: Diagnosis not present

## 2023-05-09 DIAGNOSIS — J3089 Other allergic rhinitis: Secondary | ICD-10-CM | POA: Diagnosis not present

## 2023-05-09 DIAGNOSIS — J3081 Allergic rhinitis due to animal (cat) (dog) hair and dander: Secondary | ICD-10-CM | POA: Diagnosis not present

## 2023-05-18 DIAGNOSIS — H59811 Chorioretinal scars after surgery for detachment, right eye: Secondary | ICD-10-CM | POA: Diagnosis not present

## 2023-05-18 DIAGNOSIS — J3089 Other allergic rhinitis: Secondary | ICD-10-CM | POA: Diagnosis not present

## 2023-05-18 DIAGNOSIS — J3081 Allergic rhinitis due to animal (cat) (dog) hair and dander: Secondary | ICD-10-CM | POA: Diagnosis not present

## 2023-05-18 DIAGNOSIS — H2589 Other age-related cataract: Secondary | ICD-10-CM | POA: Diagnosis not present

## 2023-05-18 DIAGNOSIS — Z8669 Personal history of other diseases of the nervous system and sense organs: Secondary | ICD-10-CM | POA: Diagnosis not present

## 2023-05-18 DIAGNOSIS — H35371 Puckering of macula, right eye: Secondary | ICD-10-CM | POA: Diagnosis not present

## 2023-05-18 DIAGNOSIS — J301 Allergic rhinitis due to pollen: Secondary | ICD-10-CM | POA: Diagnosis not present

## 2023-05-23 DIAGNOSIS — J301 Allergic rhinitis due to pollen: Secondary | ICD-10-CM | POA: Diagnosis not present

## 2023-05-23 DIAGNOSIS — J3089 Other allergic rhinitis: Secondary | ICD-10-CM | POA: Diagnosis not present

## 2023-05-23 DIAGNOSIS — J3081 Allergic rhinitis due to animal (cat) (dog) hair and dander: Secondary | ICD-10-CM | POA: Diagnosis not present

## 2023-05-30 DIAGNOSIS — J3089 Other allergic rhinitis: Secondary | ICD-10-CM | POA: Diagnosis not present

## 2023-05-30 DIAGNOSIS — J3081 Allergic rhinitis due to animal (cat) (dog) hair and dander: Secondary | ICD-10-CM | POA: Diagnosis not present

## 2023-05-30 DIAGNOSIS — J301 Allergic rhinitis due to pollen: Secondary | ICD-10-CM | POA: Diagnosis not present

## 2023-06-01 DIAGNOSIS — J301 Allergic rhinitis due to pollen: Secondary | ICD-10-CM | POA: Diagnosis not present

## 2023-06-02 DIAGNOSIS — J3089 Other allergic rhinitis: Secondary | ICD-10-CM | POA: Diagnosis not present

## 2023-06-02 DIAGNOSIS — J3081 Allergic rhinitis due to animal (cat) (dog) hair and dander: Secondary | ICD-10-CM | POA: Diagnosis not present

## 2023-06-06 DIAGNOSIS — R7303 Prediabetes: Secondary | ICD-10-CM | POA: Diagnosis not present

## 2023-06-06 DIAGNOSIS — F419 Anxiety disorder, unspecified: Secondary | ICD-10-CM | POA: Diagnosis not present

## 2023-06-06 DIAGNOSIS — Z13220 Encounter for screening for lipoid disorders: Secondary | ICD-10-CM | POA: Diagnosis not present

## 2023-06-06 DIAGNOSIS — Z0000 Encounter for general adult medical examination without abnormal findings: Secondary | ICD-10-CM | POA: Diagnosis not present

## 2023-06-06 DIAGNOSIS — L309 Dermatitis, unspecified: Secondary | ICD-10-CM | POA: Diagnosis not present

## 2023-06-06 DIAGNOSIS — Z8546 Personal history of malignant neoplasm of prostate: Secondary | ICD-10-CM | POA: Diagnosis not present

## 2023-06-06 DIAGNOSIS — I10 Essential (primary) hypertension: Secondary | ICD-10-CM | POA: Diagnosis not present

## 2023-06-06 DIAGNOSIS — Z08 Encounter for follow-up examination after completed treatment for malignant neoplasm: Secondary | ICD-10-CM | POA: Diagnosis not present

## 2023-06-08 DIAGNOSIS — C44222 Squamous cell carcinoma of skin of right ear and external auricular canal: Secondary | ICD-10-CM | POA: Diagnosis not present

## 2023-06-13 DIAGNOSIS — J301 Allergic rhinitis due to pollen: Secondary | ICD-10-CM | POA: Diagnosis not present

## 2023-06-13 DIAGNOSIS — J3081 Allergic rhinitis due to animal (cat) (dog) hair and dander: Secondary | ICD-10-CM | POA: Diagnosis not present

## 2023-06-13 DIAGNOSIS — J3089 Other allergic rhinitis: Secondary | ICD-10-CM | POA: Diagnosis not present

## 2023-06-16 DIAGNOSIS — H0279 Other degenerative disorders of eyelid and periocular area: Secondary | ICD-10-CM | POA: Diagnosis not present

## 2023-06-16 DIAGNOSIS — H02831 Dermatochalasis of right upper eyelid: Secondary | ICD-10-CM | POA: Diagnosis not present

## 2023-06-16 DIAGNOSIS — H02834 Dermatochalasis of left upper eyelid: Secondary | ICD-10-CM | POA: Diagnosis not present

## 2023-06-16 DIAGNOSIS — H02413 Mechanical ptosis of bilateral eyelids: Secondary | ICD-10-CM | POA: Diagnosis not present

## 2023-06-20 DIAGNOSIS — J3081 Allergic rhinitis due to animal (cat) (dog) hair and dander: Secondary | ICD-10-CM | POA: Diagnosis not present

## 2023-06-20 DIAGNOSIS — J301 Allergic rhinitis due to pollen: Secondary | ICD-10-CM | POA: Diagnosis not present

## 2023-06-20 DIAGNOSIS — J3089 Other allergic rhinitis: Secondary | ICD-10-CM | POA: Diagnosis not present

## 2023-06-27 DIAGNOSIS — H53483 Generalized contraction of visual field, bilateral: Secondary | ICD-10-CM | POA: Diagnosis not present

## 2023-07-14 DIAGNOSIS — J3089 Other allergic rhinitis: Secondary | ICD-10-CM | POA: Diagnosis not present

## 2023-07-14 DIAGNOSIS — J3081 Allergic rhinitis due to animal (cat) (dog) hair and dander: Secondary | ICD-10-CM | POA: Diagnosis not present

## 2023-07-14 DIAGNOSIS — J301 Allergic rhinitis due to pollen: Secondary | ICD-10-CM | POA: Diagnosis not present

## 2023-07-20 DIAGNOSIS — J3089 Other allergic rhinitis: Secondary | ICD-10-CM | POA: Diagnosis not present

## 2023-07-20 DIAGNOSIS — J301 Allergic rhinitis due to pollen: Secondary | ICD-10-CM | POA: Diagnosis not present

## 2023-07-20 DIAGNOSIS — J3081 Allergic rhinitis due to animal (cat) (dog) hair and dander: Secondary | ICD-10-CM | POA: Diagnosis not present

## 2023-07-26 DIAGNOSIS — J301 Allergic rhinitis due to pollen: Secondary | ICD-10-CM | POA: Diagnosis not present

## 2023-07-26 DIAGNOSIS — J3089 Other allergic rhinitis: Secondary | ICD-10-CM | POA: Diagnosis not present

## 2023-07-26 DIAGNOSIS — J3081 Allergic rhinitis due to animal (cat) (dog) hair and dander: Secondary | ICD-10-CM | POA: Diagnosis not present

## 2023-08-12 DIAGNOSIS — J3081 Allergic rhinitis due to animal (cat) (dog) hair and dander: Secondary | ICD-10-CM | POA: Diagnosis not present

## 2023-08-12 DIAGNOSIS — J301 Allergic rhinitis due to pollen: Secondary | ICD-10-CM | POA: Diagnosis not present

## 2023-08-12 DIAGNOSIS — J3089 Other allergic rhinitis: Secondary | ICD-10-CM | POA: Diagnosis not present

## 2023-08-23 DIAGNOSIS — H35371 Puckering of macula, right eye: Secondary | ICD-10-CM | POA: Diagnosis not present

## 2023-08-23 DIAGNOSIS — Z8669 Personal history of other diseases of the nervous system and sense organs: Secondary | ICD-10-CM | POA: Diagnosis not present

## 2023-08-23 DIAGNOSIS — H59811 Chorioretinal scars after surgery for detachment, right eye: Secondary | ICD-10-CM | POA: Diagnosis not present

## 2023-08-23 DIAGNOSIS — H25813 Combined forms of age-related cataract, bilateral: Secondary | ICD-10-CM | POA: Diagnosis not present

## 2023-08-29 DIAGNOSIS — J3089 Other allergic rhinitis: Secondary | ICD-10-CM | POA: Diagnosis not present

## 2023-08-29 DIAGNOSIS — J3081 Allergic rhinitis due to animal (cat) (dog) hair and dander: Secondary | ICD-10-CM | POA: Diagnosis not present

## 2023-08-29 DIAGNOSIS — J301 Allergic rhinitis due to pollen: Secondary | ICD-10-CM | POA: Diagnosis not present

## 2023-09-07 DIAGNOSIS — J3081 Allergic rhinitis due to animal (cat) (dog) hair and dander: Secondary | ICD-10-CM | POA: Diagnosis not present

## 2023-09-07 DIAGNOSIS — J301 Allergic rhinitis due to pollen: Secondary | ICD-10-CM | POA: Diagnosis not present

## 2023-09-07 DIAGNOSIS — J3089 Other allergic rhinitis: Secondary | ICD-10-CM | POA: Diagnosis not present

## 2023-09-15 DIAGNOSIS — J3089 Other allergic rhinitis: Secondary | ICD-10-CM | POA: Diagnosis not present

## 2023-09-15 DIAGNOSIS — J3081 Allergic rhinitis due to animal (cat) (dog) hair and dander: Secondary | ICD-10-CM | POA: Diagnosis not present

## 2023-09-15 DIAGNOSIS — J301 Allergic rhinitis due to pollen: Secondary | ICD-10-CM | POA: Diagnosis not present

## 2023-09-21 DIAGNOSIS — J301 Allergic rhinitis due to pollen: Secondary | ICD-10-CM | POA: Diagnosis not present

## 2023-09-21 DIAGNOSIS — J3089 Other allergic rhinitis: Secondary | ICD-10-CM | POA: Diagnosis not present

## 2023-09-21 DIAGNOSIS — J3081 Allergic rhinitis due to animal (cat) (dog) hair and dander: Secondary | ICD-10-CM | POA: Diagnosis not present

## 2023-09-27 DIAGNOSIS — J301 Allergic rhinitis due to pollen: Secondary | ICD-10-CM | POA: Diagnosis not present

## 2023-09-27 DIAGNOSIS — J3081 Allergic rhinitis due to animal (cat) (dog) hair and dander: Secondary | ICD-10-CM | POA: Diagnosis not present

## 2023-09-27 DIAGNOSIS — J3089 Other allergic rhinitis: Secondary | ICD-10-CM | POA: Diagnosis not present

## 2023-10-05 DIAGNOSIS — J3089 Other allergic rhinitis: Secondary | ICD-10-CM | POA: Diagnosis not present

## 2023-10-05 DIAGNOSIS — J301 Allergic rhinitis due to pollen: Secondary | ICD-10-CM | POA: Diagnosis not present

## 2023-10-05 DIAGNOSIS — J3081 Allergic rhinitis due to animal (cat) (dog) hair and dander: Secondary | ICD-10-CM | POA: Diagnosis not present

## 2023-11-08 DIAGNOSIS — J3089 Other allergic rhinitis: Secondary | ICD-10-CM | POA: Diagnosis not present

## 2023-11-08 DIAGNOSIS — J3081 Allergic rhinitis due to animal (cat) (dog) hair and dander: Secondary | ICD-10-CM | POA: Diagnosis not present

## 2023-11-08 DIAGNOSIS — J301 Allergic rhinitis due to pollen: Secondary | ICD-10-CM | POA: Diagnosis not present

## 2023-11-14 DIAGNOSIS — J3089 Other allergic rhinitis: Secondary | ICD-10-CM | POA: Diagnosis not present

## 2023-11-14 DIAGNOSIS — J301 Allergic rhinitis due to pollen: Secondary | ICD-10-CM | POA: Diagnosis not present

## 2023-11-14 DIAGNOSIS — J3081 Allergic rhinitis due to animal (cat) (dog) hair and dander: Secondary | ICD-10-CM | POA: Diagnosis not present

## 2023-11-22 DIAGNOSIS — J301 Allergic rhinitis due to pollen: Secondary | ICD-10-CM | POA: Diagnosis not present

## 2023-11-22 DIAGNOSIS — J3081 Allergic rhinitis due to animal (cat) (dog) hair and dander: Secondary | ICD-10-CM | POA: Diagnosis not present

## 2023-11-22 DIAGNOSIS — J3089 Other allergic rhinitis: Secondary | ICD-10-CM | POA: Diagnosis not present

## 2023-11-24 DIAGNOSIS — H524 Presbyopia: Secondary | ICD-10-CM | POA: Diagnosis not present

## 2023-11-28 DIAGNOSIS — J3081 Allergic rhinitis due to animal (cat) (dog) hair and dander: Secondary | ICD-10-CM | POA: Diagnosis not present

## 2023-11-28 DIAGNOSIS — J301 Allergic rhinitis due to pollen: Secondary | ICD-10-CM | POA: Diagnosis not present

## 2023-11-28 DIAGNOSIS — J3089 Other allergic rhinitis: Secondary | ICD-10-CM | POA: Diagnosis not present

## 2023-12-02 DIAGNOSIS — R590 Localized enlarged lymph nodes: Secondary | ICD-10-CM | POA: Diagnosis not present

## 2023-12-08 ENCOUNTER — Other Ambulatory Visit: Payer: Self-pay | Admitting: Physician Assistant

## 2023-12-08 DIAGNOSIS — R590 Localized enlarged lymph nodes: Secondary | ICD-10-CM

## 2023-12-13 ENCOUNTER — Inpatient Hospital Stay
Admission: RE | Admit: 2023-12-13 | Discharge: 2023-12-13 | Discharge status: Home / Self Care | Source: Ambulatory Visit | Attending: Physician Assistant | Admitting: Physician Assistant

## 2023-12-13 DIAGNOSIS — R590 Localized enlarged lymph nodes: Secondary | ICD-10-CM

## 2023-12-13 DIAGNOSIS — J301 Allergic rhinitis due to pollen: Secondary | ICD-10-CM | POA: Diagnosis not present

## 2023-12-13 DIAGNOSIS — R221 Localized swelling, mass and lump, neck: Secondary | ICD-10-CM | POA: Diagnosis not present

## 2023-12-13 DIAGNOSIS — J3081 Allergic rhinitis due to animal (cat) (dog) hair and dander: Secondary | ICD-10-CM | POA: Diagnosis not present

## 2023-12-13 DIAGNOSIS — J3089 Other allergic rhinitis: Secondary | ICD-10-CM | POA: Diagnosis not present

## 2024-01-02 DIAGNOSIS — J301 Allergic rhinitis due to pollen: Secondary | ICD-10-CM | POA: Diagnosis not present

## 2024-01-02 DIAGNOSIS — J3089 Other allergic rhinitis: Secondary | ICD-10-CM | POA: Diagnosis not present

## 2024-01-02 DIAGNOSIS — J3081 Allergic rhinitis due to animal (cat) (dog) hair and dander: Secondary | ICD-10-CM | POA: Diagnosis not present

## 2024-01-10 DIAGNOSIS — J3081 Allergic rhinitis due to animal (cat) (dog) hair and dander: Secondary | ICD-10-CM | POA: Diagnosis not present

## 2024-01-10 DIAGNOSIS — J3089 Other allergic rhinitis: Secondary | ICD-10-CM | POA: Diagnosis not present

## 2024-01-10 DIAGNOSIS — J301 Allergic rhinitis due to pollen: Secondary | ICD-10-CM | POA: Diagnosis not present

## 2024-01-16 DIAGNOSIS — H52221 Regular astigmatism, right eye: Secondary | ICD-10-CM | POA: Diagnosis not present

## 2024-01-16 DIAGNOSIS — H25011 Cortical age-related cataract, right eye: Secondary | ICD-10-CM | POA: Diagnosis not present

## 2024-01-16 DIAGNOSIS — H2511 Age-related nuclear cataract, right eye: Secondary | ICD-10-CM | POA: Diagnosis not present

## 2024-01-16 DIAGNOSIS — H25811 Combined forms of age-related cataract, right eye: Secondary | ICD-10-CM | POA: Diagnosis not present

## 2024-01-19 DIAGNOSIS — J301 Allergic rhinitis due to pollen: Secondary | ICD-10-CM | POA: Diagnosis not present

## 2024-01-19 DIAGNOSIS — J3081 Allergic rhinitis due to animal (cat) (dog) hair and dander: Secondary | ICD-10-CM | POA: Diagnosis not present

## 2024-01-19 DIAGNOSIS — J3089 Other allergic rhinitis: Secondary | ICD-10-CM | POA: Diagnosis not present

## 2024-01-24 DIAGNOSIS — J3081 Allergic rhinitis due to animal (cat) (dog) hair and dander: Secondary | ICD-10-CM | POA: Diagnosis not present

## 2024-01-24 DIAGNOSIS — J3089 Other allergic rhinitis: Secondary | ICD-10-CM | POA: Diagnosis not present

## 2024-01-24 DIAGNOSIS — J301 Allergic rhinitis due to pollen: Secondary | ICD-10-CM | POA: Diagnosis not present

## 2024-02-01 DIAGNOSIS — M542 Cervicalgia: Secondary | ICD-10-CM | POA: Diagnosis not present

## 2024-02-06 DIAGNOSIS — M542 Cervicalgia: Secondary | ICD-10-CM | POA: Diagnosis not present

## 2024-02-08 DIAGNOSIS — J301 Allergic rhinitis due to pollen: Secondary | ICD-10-CM | POA: Diagnosis not present

## 2024-02-08 DIAGNOSIS — J3081 Allergic rhinitis due to animal (cat) (dog) hair and dander: Secondary | ICD-10-CM | POA: Diagnosis not present

## 2024-02-08 DIAGNOSIS — J3089 Other allergic rhinitis: Secondary | ICD-10-CM | POA: Diagnosis not present

## 2024-02-13 DIAGNOSIS — J301 Allergic rhinitis due to pollen: Secondary | ICD-10-CM | POA: Diagnosis not present

## 2024-02-13 DIAGNOSIS — J3089 Other allergic rhinitis: Secondary | ICD-10-CM | POA: Diagnosis not present

## 2024-02-13 DIAGNOSIS — J3081 Allergic rhinitis due to animal (cat) (dog) hair and dander: Secondary | ICD-10-CM | POA: Diagnosis not present

## 2024-02-15 DIAGNOSIS — M542 Cervicalgia: Secondary | ICD-10-CM | POA: Diagnosis not present

## 2024-02-16 DIAGNOSIS — K080 Exfoliation of teeth due to systemic causes: Secondary | ICD-10-CM | POA: Diagnosis not present

## 2024-02-22 DIAGNOSIS — M542 Cervicalgia: Secondary | ICD-10-CM | POA: Diagnosis not present

## 2024-02-27 DIAGNOSIS — J301 Allergic rhinitis due to pollen: Secondary | ICD-10-CM | POA: Diagnosis not present

## 2024-02-27 DIAGNOSIS — J3089 Other allergic rhinitis: Secondary | ICD-10-CM | POA: Diagnosis not present

## 2024-02-27 DIAGNOSIS — J3081 Allergic rhinitis due to animal (cat) (dog) hair and dander: Secondary | ICD-10-CM | POA: Diagnosis not present

## 2024-02-29 DIAGNOSIS — M542 Cervicalgia: Secondary | ICD-10-CM | POA: Diagnosis not present

## 2024-03-30 DIAGNOSIS — J3081 Allergic rhinitis due to animal (cat) (dog) hair and dander: Secondary | ICD-10-CM | POA: Diagnosis not present

## 2024-03-30 DIAGNOSIS — J301 Allergic rhinitis due to pollen: Secondary | ICD-10-CM | POA: Diagnosis not present

## 2024-03-30 DIAGNOSIS — J3089 Other allergic rhinitis: Secondary | ICD-10-CM | POA: Diagnosis not present

## 2024-04-10 DIAGNOSIS — H02834 Dermatochalasis of left upper eyelid: Secondary | ICD-10-CM | POA: Diagnosis not present

## 2024-04-10 DIAGNOSIS — H02831 Dermatochalasis of right upper eyelid: Secondary | ICD-10-CM | POA: Diagnosis not present

## 2024-04-23 DIAGNOSIS — J3081 Allergic rhinitis due to animal (cat) (dog) hair and dander: Secondary | ICD-10-CM | POA: Diagnosis not present

## 2024-04-23 DIAGNOSIS — J301 Allergic rhinitis due to pollen: Secondary | ICD-10-CM | POA: Diagnosis not present

## 2024-04-23 DIAGNOSIS — J3089 Other allergic rhinitis: Secondary | ICD-10-CM | POA: Diagnosis not present
